# Patient Record
Sex: Male | Born: 1990 | Race: Black or African American | Marital: Single | Smoking: Former smoker
Health system: Southern US, Community
[De-identification: ages and names within clinical notes are randomized; demographics above are authoritative.]

## PROBLEM LIST (undated history)

## (undated) DIAGNOSIS — I10 Essential (primary) hypertension: Secondary | ICD-10-CM

## (undated) DIAGNOSIS — J302 Other seasonal allergic rhinitis: Secondary | ICD-10-CM

---

## 2016-11-27 ENCOUNTER — Ambulatory Visit: Payer: Self-pay

## 2016-11-27 ENCOUNTER — Ambulatory Visit (INDEPENDENT_AMBULATORY_CARE_PROVIDER_SITE_OTHER): Payer: Self-pay

## 2016-11-27 ENCOUNTER — Encounter: Payer: Self-pay | Admitting: *Deleted

## 2016-11-27 ENCOUNTER — Ambulatory Visit
Admission: EM | Admit: 2016-11-27 | Discharge: 2016-11-27 | Disposition: A | Discharge status: Home / Self Care | Payer: 59 | Attending: Family Medicine | Admitting: Family Medicine

## 2016-11-27 DIAGNOSIS — S61211A Laceration without foreign body of left index finger without damage to nail, initial encounter: Secondary | ICD-10-CM | POA: Diagnosis not present

## 2016-11-27 DIAGNOSIS — W260XXA Contact with knife, initial encounter: Secondary | ICD-10-CM | POA: Diagnosis not present

## 2016-11-27 DIAGNOSIS — S61209A Unspecified open wound of unspecified finger without damage to nail, initial encounter: Secondary | ICD-10-CM

## 2016-11-27 DIAGNOSIS — S61219A Laceration without foreign body of unspecified finger without damage to nail, initial encounter: Secondary | ICD-10-CM

## 2016-11-27 DIAGNOSIS — Z23 Encounter for immunization: Secondary | ICD-10-CM | POA: Diagnosis not present

## 2016-11-27 MED ORDER — LIDOCAINE-EPINEPHRINE-TETRACAINE (LET) SOLUTION
3.0000 mL | Freq: Once | NASAL | Status: AC
Start: 2016-11-27 — End: 2016-11-27
  Administered 2016-11-27: 3 mL via TOPICAL

## 2016-11-27 MED ORDER — AMOXICILLIN 875 MG PO TABS: 875.0000 mg | ORAL_TABLET | Freq: Two times a day (BID) | ORAL | 0 refills | Status: DC

## 2016-11-27 MED ORDER — TETANUS-DIPHTH-ACELL PERTUSSIS 5-2.5-18.5 LF-MCG/0.5 IM SUSP
0.5000 mL | Freq: Once | INTRAMUSCULAR | Status: AC
Start: 2016-11-27 — End: 2016-11-27
  Administered 2016-11-27: 0.5 mL via INTRAMUSCULAR

## 2016-11-27 NOTE — ED Triage Notes (Signed)
Patient accidentally cut his left index finger with his pocket knife today.

## 2016-11-27 NOTE — ED Provider Notes (Signed)
[facility]    CSN: [id number] Arrival date & time: 11/27/16  1558     History   Chief Complaint Chief Complaint  Patient presents with  . Extremity Laceration    HPI Brent Benson. is a 26 y.o. male.   26 yo male with a c/o left index finger laceration after accidentally cutting it with a knife several hours ago. Does not recall his last tetanus vaccine.    The history is provided by the patient.    History reviewed. No pertinent past medical history.  There are no active problems to display for this patient.   History reviewed. No pertinent surgical history.     Home Medications    Prior to Admission medications   Medication Sig Start Date End Date Taking? Authorizing Provider  amoxicillin (AMOXIL) 875 MG tablet Take 1 tablet (875 mg total) by mouth 2 (two) times daily. 11/27/16   Payton Mccallum, MD    Family History History reviewed. No pertinent family history.  Social History Social History  Substance Use Topics  . Smoking status: Current Some Day Smoker  . Smokeless tobacco: Never Used  . Alcohol use Yes     Allergies   Patient has no known allergies.   Review of Systems Review of Systems   Physical Exam Triage Vital Signs ED Triage Vitals  Enc Vitals Group     BP 11/27/16 1607 (!) 147/96     Pulse Rate 11/27/16 1607 (!) 113     Resp 11/27/16 1607 16     Temp 11/27/16 1607 99.4 F (37.4 C)     Temp Source 11/27/16 1607 Oral     SpO2 11/27/16 1607 97 %     Weight 11/27/16 1609 268 lb (121.6 kg)     Height 11/27/16 1609 5\' 10"  (1.778 m)     Head Circumference --      Peak Flow --      Pain Score 11/27/16 1609 10     Pain Loc --      Pain Edu? --      Excl. in GC? --    No data found.   Updated Vital Signs BP (!) 147/96 (BP Location: Right Arm)   Pulse (!) 113   Temp 99.4 F (37.4 C) (Oral)   Resp 16   Ht 5\' 10"  (1.778 m)   Wt 268 lb (121.6 kg)   SpO2 97%   BMI 38.45 kg/m   Visual Acuity Right Eye  Distance:   Left Eye Distance:   Bilateral Distance:    Right Eye Near:   Left Eye Near:    Bilateral Near:     Physical Exam  Constitutional: He appears well-developed and well-nourished.  Musculoskeletal:       Left hand: He exhibits tenderness, laceration (5cm left index finger laceration) and swelling. He exhibits normal range of motion, no bony tenderness, normal two-point discrimination, normal capillary refill and no deformity. Normal sensation noted. Normal strength noted.       Hands: Nursing note and vitals reviewed.    UC Treatments / Results  Labs (all labs ordered are listed, but only abnormal results are displayed) Labs Reviewed - No data to display  EKG  EKG Interpretation None       Radiology Dg Finger Index Left  Result Date: 11/27/2016 CLINICAL DATA:  Laceration left index finger with a pocket knife today. Initial encounter. EXAM: LEFT INDEX FINGER 2+V COMPARISON:  None. FINDINGS: No fracture, dislocation or radiopaque  foreign body is identified. Laceration along the radial aspect of the index finger at approximately the level of the PIP joint noted. IMPRESSION: Laceration without underlying fracture or foreign body. Electronically Signed   By: Drusilla Kanner M.D.   On: 11/27/2016 17:29    Procedures .Marland KitchenLaceration Repair Date/Time: 11/27/2016 8:05 PM Performed by: Payton Mccallum Authorized by: Payton Mccallum   Consent:    Consent obtained:  Verbal   Consent given by:  Patient   Risks discussed:  Infection, need for additional repair, nerve damage, pain, poor cosmetic result, retained foreign body, tendon damage and vascular damage Anesthesia (see MAR for exact dosages):    Anesthesia method:  Topical application and local infiltration   Topical anesthetic:  LET   Local anesthetic:  Lidocaine 1% WITH epi Laceration details:    Location:  Finger   Finger location:  L index finger   Length (cm):  5 Repair type:    Repair type:   Simple Pre-procedure details:    Preparation:  Patient was prepped and draped in usual sterile fashion Exploration:    Hemostasis achieved with:  Cautery, direct pressure, LET, tourniquet and epinephrine   Wound exploration: wound explored through full range of motion and entire depth of wound probed and visualized     Wound extent: areolar tissue violated     Wound extent: no foreign bodies/material noted, no tendon damage noted and no underlying fracture noted     Contaminated: no   Treatment:    Area cleansed with:  Betadine and Hibiclens   Amount of cleaning:  Standard   Irrigation solution:  Sterile saline   Irrigation method:  Syringe   Foreign body removal: no foreign bodies visualized.   Skin repair:    Repair method:  Sutures   Suture size:  4-0   Suture material:  Nylon   Suture technique:  Simple interrupted   Number of sutures:  12 Approximation:    Approximation:  Close Post-procedure details:    Dressing:  Bulky dressing and splint for protection   Patient tolerance of procedure:  Tolerated well, no immediate complications    (including critical care time)  Medications Ordered in UC Medications  Tdap (BOOSTRIX) injection 0.5 mL (0.5 mLs Intramuscular Given 11/27/16 1642)  lidocaine-EPINEPHrine-tetracaine (LET) solution (3 mLs Topical Given 11/27/16 1651)     Initial Impression / Assessment and Plan / UC Course  I have reviewed the triage vital signs and the nursing notes.  Pertinent labs & imaging results that were available during my care of the patient were reviewed by me and considered in my medical decision making (see chart for details).       Final Clinical Impressions(s) / UC Diagnoses   Final diagnoses:  Laceration of left index finger without foreign body without damage to nail, initial encounter    New Prescriptions Discharge Medication List as of 11/27/2016  7:06 PM    START taking these medications   Details  amoxicillin (AMOXIL) 875 MG  tablet Take 1 tablet (875 mg total) by mouth 2 (two) times daily., Starting Wed 11/27/2016, Normal       1. diagnosis reviewed with patient 2. Laceration repair as per note above 3. Given tetanus vaccine 4. rx as per orders above; reviewed possible side effects, interactions, risks and benefits  5. Recommend supportive treatment with routine wound care (verbal and written information given) 6. Follow-up in 8 days for suture removal (or sooner prn)   Controlled Substance Prescriptions [place] Controlled Substance Registry  consulted? Not Applicable   Payton Mccallum, MD 11/27/16 [phone]

## 2016-11-27 NOTE — Discharge Instructions (Signed)
Follow up next Friday morning for suture removal (or sooner if any problems)

## 2016-12-06 ENCOUNTER — Ambulatory Visit
Admission: EM | Admit: 2016-12-06 | Discharge: 2016-12-06 | Disposition: A | Discharge status: Home / Self Care | Payer: 59 | Attending: Family Medicine | Admitting: Family Medicine

## 2016-12-06 DIAGNOSIS — Z5189 Encounter for other specified aftercare: Secondary | ICD-10-CM | POA: Diagnosis not present

## 2016-12-06 MED ORDER — AMOXICILLIN 875 MG PO TABS: 875.0000 mg | ORAL_TABLET | Freq: Two times a day (BID) | ORAL | 0 refills | Status: DC

## 2016-12-06 NOTE — ED Triage Notes (Signed)
Pt in for suture removal left hand index finger. Denies pain, Edges well approximated, denies drainage.

## 2016-12-06 NOTE — ED Provider Notes (Signed)
[facility]    CSN: [id number] Arrival date & time: 12/06/16  1145     History   Chief Complaint Chief Complaint  Patient presents with  . Suture / Staple Removal    HPI Brent BreedMark Anthony Bibian Jr. is a 26 y.o. male.   26 yo male with a h/o finger laceration here for recheck wound and possible suture removal. States doing well/better but still with slight pain/tenderness after "bumping finger" several days ago. Denies any fevers, chills.    The history is provided by the patient.  Suture / Staple Removal     History reviewed. No pertinent past medical history.  There are no active problems to display for this patient.   History reviewed. No pertinent surgical history.     Home Medications    Prior to Admission medications   Medication Sig Start Date End Date Taking? Authorizing Provider  amoxicillin (AMOXIL) 875 MG tablet Take 1 tablet (875 mg total) by mouth 2 (two) times daily. 12/06/16   Payton Mccallumonty, Carsyn Boster, MD    Family History History reviewed. No pertinent family history.  Social History Social History  Substance Use Topics  . Smoking status: Current Some Day Smoker  . Smokeless tobacco: Never Used  . Alcohol use Yes     Allergies   Patient has no known allergies.   Review of Systems Review of Systems   Physical Exam Triage Vital Signs ED Triage Vitals  Enc Vitals Group     BP 12/06/16 1147 (!) 151/99     Pulse Rate 12/06/16 1147 77     Resp 12/06/16 1147 18     Temp 12/06/16 1147 98.9 F (37.2 C)     Temp Source 12/06/16 1147 Oral     SpO2 12/06/16 1147 100 %     Weight 12/06/16 1148 268 lb (121.6 kg)     Height 12/06/16 1148 5\' 10"  (1.778 m)     Head Circumference --      Peak Flow --      Pain Score 12/06/16 1205 0     Pain Loc --      Pain Edu? --      Excl. in GC? --    No data found.   Updated Vital Signs BP (!) 151/99 (BP Location: Left Arm)   Pulse 77   Temp 98.9 F (37.2 C) (Oral)   Resp 18   Ht 5\' 10"   (1.778 m)   Wt 268 lb (121.6 kg)   SpO2 100%   BMI 38.45 kg/m   Visual Acuity Right Eye Distance:   Left Eye Distance:   Bilateral Distance:    Right Eye Near:   Left Eye Near:    Bilateral Near:     Physical Exam  Constitutional: He appears well-developed and well-nourished. No distress.  Musculoskeletal:       Left hand: Lacerations: healling index finger laceration; no erythema or drainage; slight tenderness and slight edema noted.  Skin: He is not diaphoretic.  Nursing note and vitals reviewed.    UC Treatments / Results  Labs (all labs ordered are listed, but only abnormal results are displayed) Labs Reviewed - No data to display  EKG  EKG Interpretation None       Radiology No results found.  Procedures Procedures (including critical care time)  Medications Ordered in UC Medications - No data to display   Initial Impression / Assessment and Plan / UC Course  I have reviewed the triage vital signs and  the nursing notes.  Pertinent labs & imaging results that were available during my care of the patient were reviewed by me and considered in my medical decision making (see chart for details).       Final Clinical Impressions(s) / UC Diagnoses   Final diagnoses:  Visit for wound check    New Prescriptions Discharge Medication List as of 12/06/2016 12:05 PM     1.diagnosis reviewed with patient/ 2. Recommend supportive treatment with continued wound care; refilled antibiotic;  4. Follow-up in 3 days for suture removal  Controlled Substance Prescriptions [place] Controlled Substance Registry consulted? Not Applicable   Payton Mccallum, MD 12/06/16 2207

## 2016-12-06 NOTE — Discharge Instructions (Signed)
Follow up next Monday or Tuesday for suture removal

## 2016-12-10 ENCOUNTER — Ambulatory Visit
Admission: EM | Admit: 2016-12-10 | Discharge: 2016-12-10 | Disposition: A | Discharge status: Home / Self Care | Payer: 59 | Attending: Family Medicine | Admitting: Family Medicine

## 2016-12-10 DIAGNOSIS — Z4802 Encounter for removal of sutures: Secondary | ICD-10-CM

## 2016-12-10 NOTE — ED Triage Notes (Signed)
Pt here for suture removal.left hand first finger. Edges well approximated and denies pain or drainage.

## 2016-12-10 NOTE — ED Provider Notes (Signed)
[facility]    CSN: [id number] Arrival date & time: 12/10/16  [id number]     History   Chief Complaint Chief Complaint  Patient presents with  . Suture / Staple Removal    HPI Brent BreedMark Anthony Vasek Jr. is a 26 y.o. male.   HPI  This a 26 year old male who presents 14 days after sutures were placed into his left hand index finger. The edges are well approximated the sutures are in good condition. There is no drainage. Does have some mild tenderness along the proximalmost portion. His finger is stiff from nonuse. There is no evidence of any infection.        History reviewed. No pertinent past medical history.  There are no active problems to display for this patient.   History reviewed. No pertinent surgical history.     Home Medications    Prior to Admission medications   Medication Sig Start Date End Date Taking? Authorizing Provider  amoxicillin (AMOXIL) 875 MG tablet Take 1 tablet (875 mg total) by mouth 2 (two) times daily. 12/06/16   Payton Mccallumonty, [place], MD    Family History History reviewed. No pertinent family history.  Social History Social History  Substance Use Topics  . Smoking status: Current Some Day Smoker  . Smokeless tobacco: Never Used  . Alcohol use Yes     Allergies   Patient has no known allergies.   Review of Systems Review of Systems  Constitutional: Positive for activity change. Negative for chills, fatigue and fever.  Skin: Positive for wound.  All other systems reviewed and are negative.    Physical Exam Triage Vital Signs ED Triage Vitals  Enc Vitals Group     BP 12/10/16 1012 (!) 152/89     Pulse Rate 12/10/16 1012 85     Resp 12/10/16 1012 18     Temp 12/10/16 1012 98.8 F (37.1 C)     Temp Source 12/10/16 1012 Oral     SpO2 12/10/16 1012 100 %     Weight 12/10/16 1012 268 lb (121.6 kg)     Height 12/10/16 1012 5\' 10"  (1.778 m)     Head Circumference --      Peak Flow --      Pain Score 12/10/16 1049 0   Pain Loc --      Pain Edu? --      Excl. in GC? --    No data found.   Updated Vital Signs BP (!) 152/89 (BP Location: Right Arm)   Pulse 85   Temp 98.8 F (37.1 C) (Oral)   Resp 18   Ht 5\' 10"  (1.778 m)   Wt 268 lb (121.6 kg)   SpO2 100%   BMI 38.45 kg/m   Visual Acuity Right Eye Distance:   Left Eye Distance:   Bilateral Distance:    Right Eye Near:   Left Eye Near:    Bilateral Near:     Physical Exam  Constitutional: He is oriented to person, place, and time. He appears well-developed and well-nourished. No distress.  HENT:  Head: Normocephalic.  Eyes: Pupils are equal, round, and reactive to light.  Neck: Normal range of motion.  Musculoskeletal: He exhibits edema and tenderness.  Examination of the left nondominant index finger shows a well-healed incision with numerous sutures in place. There is no evidence of any drainage there is no erythema. The patient's joints DIP PIP and MP joint are all stiff.  Neurological: He is alert and oriented to person,  place, and time.  Skin: Skin is warm and dry. He is not diaphoretic.  Psychiatric: He has a normal mood and affect. His behavior is normal. Judgment and thought content normal.  Nursing note and vitals reviewed.    UC Treatments / Results  Labs (all labs ordered are listed, but only abnormal results are displayed) Labs Reviewed - No data to display  EKG  EKG Interpretation None       Radiology No results found.  Procedures Procedures (including critical care time)  Medications Ordered in UC Medications - No data to display   Initial Impression / Assessment and Plan / UC Course  I have reviewed the triage vital signs and the nursing notes.  Pertinent labs & imaging results that were available during my care of the patient were reviewed by me and considered in my medical decision making (see chart for details).     Sutures were removed by the nurse. The skin edges were well opposed. No opening  of the skin was seen. There is no drainage. Swelling. I have instructed the patient and range of motion exercises and demonstrated to him. He should perform this a several times every hour. He denies any problems in the future should return to our clinic.  Final Clinical Impressions(s) / UC Diagnoses   Final diagnoses:  Encounter for removal of sutures    New Prescriptions Discharge Medication List as of 12/10/2016 10:51 AM       Controlled Substance Prescriptions [place] Controlled Substance Registry consulted? Not Applicable   Lutricia Feil, PA-C 12/10/16 2440

## 2016-12-10 NOTE — ED Notes (Signed)
Bacitracin to wound and covered with kerlix

## 2016-12-10 NOTE — ED Notes (Signed)
12 sutures removed left hand index finger

## 2017-08-05 ENCOUNTER — Encounter (HOSPITAL_COMMUNITY): Payer: Self-pay | Admitting: Emergency Medicine

## 2017-08-05 ENCOUNTER — Ambulatory Visit (HOSPITAL_COMMUNITY)
Admission: EM | Admit: 2017-08-05 | Discharge: 2017-08-05 | Disposition: A | Discharge status: Home / Self Care | Payer: 59 | Attending: Family Medicine | Admitting: Family Medicine

## 2017-08-05 DIAGNOSIS — F17210 Nicotine dependence, cigarettes, uncomplicated: Secondary | ICD-10-CM | POA: Diagnosis not present

## 2017-08-05 DIAGNOSIS — Z202 Contact with and (suspected) exposure to infections with a predominantly sexual mode of transmission: Secondary | ICD-10-CM

## 2017-08-05 DIAGNOSIS — R369 Urethral discharge, unspecified: Secondary | ICD-10-CM | POA: Insufficient documentation

## 2017-08-05 DIAGNOSIS — Z113 Encounter for screening for infections with a predominantly sexual mode of transmission: Secondary | ICD-10-CM

## 2017-08-05 MED ORDER — AZITHROMYCIN 250 MG PO TABS
1000.0000 mg | ORAL_TABLET | Freq: Once | ORAL | Status: AC
  Administered 2017-08-05: 1000 mg via ORAL

## 2017-08-05 MED ORDER — CEFTRIAXONE SODIUM 250 MG IJ SOLR
INTRAMUSCULAR | Status: AC
  Filled 2017-08-05: qty 250

## 2017-08-05 MED ORDER — CEFTRIAXONE SODIUM 250 MG IJ SOLR
250.0000 mg | Freq: Once | INTRAMUSCULAR | Status: AC
  Administered 2017-08-05: 250 mg via INTRAMUSCULAR

## 2017-08-05 MED ORDER — AZITHROMYCIN 250 MG PO TABS
ORAL_TABLET | ORAL | Status: AC
  Filled 2017-08-05: qty 4

## 2017-08-05 NOTE — ED Triage Notes (Signed)
Pt sts penile discharge  

## 2017-08-05 NOTE — ED Provider Notes (Signed)
[facility]   [id number] 08/05/17 Arrival Time: 1131   SUBJECTIVE:  Brent Benson. is a 27 y.o. male who presents to the urgent care with complaint of penile discharge for the past week.  He engaged in some traumatic sex at the onset with his partner.  No fever or joint pain.  Yellow discharge.  He has been taking amoxicillin intermittently  History reviewed. No pertinent past medical history. History reviewed. No pertinent family history. Social History   Socioeconomic History  . Marital status: Single    Spouse name: Not on file  . Number of children: Not on file  . Years of education: Not on file  . Highest education level: Not on file  Occupational History  . Not on file  Social Needs  . Financial resource strain: Not on file  . Food insecurity:    Worry: Not on file    Inability: Not on file  . Transportation needs:    Medical: Not on file    Non-medical: Not on file  Tobacco Use  . Smoking status: Current Some Day Smoker  . Smokeless tobacco: Never Used  Substance and Sexual Activity  . Alcohol use: Yes  . Drug use: No  . Sexual activity: Not on file  Lifestyle  . Physical activity:    Days per week: Not on file    Minutes per session: Not on file  . Stress: Not on file  Relationships  . Social connections:    Talks on phone: Not on file    Gets together: Not on file    Attends religious service: Not on file    Active member of club or organization: Not on file    Attends meetings of clubs or organizations: Not on file    Relationship status: Not on file  . Intimate partner violence:    Fear of current or ex partner: Not on file    Emotionally abused: Not on file    Physically abused: Not on file    Forced sexual activity: Not on file  Other Topics Concern  . Not on file  Social History Narrative  . Not on file   No outpatient medications have been marked as taking for the 08/05/17 encounter [facility] Encounter).   No Known  Allergies    ROS: As per HPI, remainder of ROS negative.   OBJECTIVE:   Vitals:   08/05/17 1156  BP: (!) 157/104  Pulse: 73  Resp: 18  Temp: 98.5 F (36.9 C)  TempSrc: Oral  SpO2: 100%     General appearance: alert; no distress Eyes: PERRL; EOMI; conjunctiva normal HENT: normocephalic; atraumatic; TMs normal, canal normal, external ears normal without trauma; nasal mucosa normal; oral mucosa normal Neck: supple Penis:  Crusty meatus, shaft normal; circumcised Back: no CVA tenderness Extremities: no cyanosis or edema; symmetrical with no gross deformities Skin: warm and dry Neurologic: normal gait; grossly normal Psychological: alert and cooperative; normal mood and affect      Labs:  No results found for this or any previous visit.  Labs Reviewed  HIV ANTIBODY (ROUTINE TESTING)  RPR  URINE CYTOLOGY ANCILLARY ONLY    No results found.     ASSESSMENT & PLAN:  1. STD exposure   2. Penile discharge     Meds ordered this encounter  Medications  . azithromycin (ZITHROMAX) tablet 1,000 mg  . cefTRIAXone (ROCEPHIN) injection 250 mg    Reviewed expectations re: course of current medical issues. Questions answered. Outlined  signs and symptoms indicating need for more acute intervention. Patient verbalized understanding. After Visit Summary given.       Elvina Sidle, MD 08/05/17 1214

## 2017-08-06 LAB — URINE CYTOLOGY ANCILLARY ONLY
Chlamydia: NEGATIVE
Neisseria Gonorrhea: NEGATIVE
Trichomonas: NEGATIVE

## 2017-08-06 LAB — RPR: RPR Ser Ql: NONREACTIVE

## 2017-08-07 LAB — HIV ANTIBODY (ROUTINE TESTING W REFLEX): HIV Screen 4th Generation wRfx: NONREACTIVE

## 2017-08-07 NOTE — Progress Notes (Signed)
Attempted to reach patient regarding normal result.

## 2019-01-29 ENCOUNTER — Other Ambulatory Visit: Payer: Self-pay

## 2019-01-29 DIAGNOSIS — Z20822 Contact with and (suspected) exposure to covid-19: Secondary | ICD-10-CM

## 2019-01-31 LAB — NOVEL CORONAVIRUS, NAA: SARS-CoV-2, NAA: NOT DETECTED

## 2019-04-21 ENCOUNTER — Other Ambulatory Visit: Payer: Self-pay

## 2019-04-21 ENCOUNTER — Ambulatory Visit (HOSPITAL_COMMUNITY)
Admission: EM | Admit: 2019-04-21 | Discharge: 2019-04-21 | Disposition: A | Discharge status: Home / Self Care | Payer: 59 | Attending: Family Medicine | Admitting: Family Medicine

## 2019-04-21 ENCOUNTER — Encounter (HOSPITAL_COMMUNITY): Payer: Self-pay

## 2019-04-21 DIAGNOSIS — R253 Fasciculation: Secondary | ICD-10-CM

## 2019-04-21 DIAGNOSIS — I10 Essential (primary) hypertension: Secondary | ICD-10-CM

## 2019-04-21 MED ORDER — AMLODIPINE BESYLATE 5 MG PO TABS: 5.0000 mg | ORAL_TABLET | Freq: Every day | ORAL | 1 refills | Status: DC

## 2019-04-21 NOTE — ED Provider Notes (Signed)
[facility]   [id number] 04/21/19 Arrival Time: 4098  ASSESSMENT & PLAN:  1. Muscle fasciculation   2. Uncontrolled hypertension     Comfortable with observation of R forearm muscle fasciculations first noticed this morning. Declines any lab testing. Would like to begin HTN medication again; unsure what he has taken in the past.  Begin: Meds ordered this encounter  Medications  . amLODipine (NORVASC) 5 MG tablet    Sig: Take 1 tablet (5 mg total) by mouth daily.    Dispense:  30 tablet    Refill:  1    Recommend: Follow-up Information    Schedule an appointment as soon as possible for a visit  with [facility].   Contact information: [address] [place] [phone]       Schedule an appointment as soon as possible for a visit  with Primary Care at [facility].   Specialty: Family Medicine Contact information: [address] [place] [phone]           Reviewed expectations re: course of current medical issues. Questions answered. Outlined signs and symptoms indicating need for more acute intervention. Patient verbalized understanding. After Visit Summary given.  SUBJECTIVE: History from: patient. Brent Benson. is a 29 y.o. male who reports noticing "tingling" of his right forearm upon waking this morning. "Like skin was twitching". Still feeling occasionally but has improved. No h/o similar. No injury to area. Questions if he slept wrong on it. No associated pain. No extremity sensation changes or weakness. No new medications. No specific aggravating or alleviating factors reported.  Increased blood pressure noted today. Reports that he has been treated for hypertension in the past.  He reports no chest pain on exertion, no dyspnea on exertion, no swelling of ankles, no orthostatic dizziness or lightheadedness, no orthopnea or paroxysmal  nocturnal dyspnea, no palpitations and no intermittent claudication symptoms. Hasn't taken medication in a long time.   History reviewed. No pertinent surgical history.   ROS: As per HPI. All other systems negative.    OBJECTIVE:  Vitals:   04/21/19 1024 04/21/19 1025  BP:  (!) 173/93  Pulse:  77  Resp:  18  Temp:  98.4 F (36.9 C)  TempSrc:  Oral  SpO2:  100%  Weight: 131.5 kg     General appearance: alert; no distress HEENT: [place]; AT Neck: supple with FROM Resp: unlabored respirations Extremities: . RUE: warm with well perfused appearance; without localized tenderness over right forearm; no visualized muscle fasciculations; without gross deformities; swelling: none; bruising: none; elbow and wrist ROM: normal CV: brisk extremity capillary refill of RUE; 2+ radial pulse of RUE. Skin: warm and dry; no visible rashes Neurologic: gait normal; normal sensation and strength of RUE Psychological: alert and cooperative; normal mood and affect   No Known Allergies  PMH: HTN  Social History   Socioeconomic History  . Marital status: Single    Spouse name: Not on file  . Number of children: Not on file  . Years of education: Not on file  . Highest education level: Not on file  Occupational History  . Not on file  Tobacco Use  . Smoking status: Current Some Day Smoker  . Smokeless tobacco: Never Used  Substance and Sexual Activity  . Alcohol use: Yes  . Drug use: No  . Sexual activity: Not on file  Other Topics Concern  . Not on file  Social  History Narrative  . Not on file   Social Determinants of Health   Financial Resource Strain:   . Difficulty of Paying Living Expenses: Not on file  Food Insecurity:   . Worried About Programme researcher, broadcasting/film/video in the Last Year: Not on file  . Ran Out of Food in the Last Year: Not on file  Transportation Needs:   . Lack of Transportation (Medical): Not on file  . Lack of Transportation (Non-Medical): Not on file  Physical  Activity:   . Days of Exercise per Week: Not on file  . Minutes of Exercise per Session: Not on file  Stress:   . Feeling of Stress : Not on file  Social Connections:   . Frequency of Communication with Friends and Family: Not on file  . Frequency of Social Gatherings with Friends and Family: Not on file  . Attends Religious Services: Not on file  . Active Member of Clubs or Organizations: Not on file  . Attends Banker Meetings: Not on file  . Marital Status: Not on file   History reviewed. No pertinent surgical history.    [name], MD 04/21/19 1236

## 2019-04-21 NOTE — ED Triage Notes (Signed)
Pt states he wake up his right forearm was tingling.

## 2019-04-29 ENCOUNTER — Encounter: Payer: Self-pay | Admitting: Internal Medicine

## 2019-04-29 ENCOUNTER — Other Ambulatory Visit: Payer: Self-pay

## 2019-04-29 ENCOUNTER — Ambulatory Visit (INDEPENDENT_AMBULATORY_CARE_PROVIDER_SITE_OTHER): Payer: 59 | Admitting: Internal Medicine

## 2019-04-29 VITALS — BP 133/74 | HR 86 | Temp 98.7°F | Ht 70.0 in | Wt 290.9 lb

## 2019-04-29 DIAGNOSIS — I10 Essential (primary) hypertension: Secondary | ICD-10-CM | POA: Insufficient documentation

## 2019-04-29 DIAGNOSIS — Z79899 Other long term (current) drug therapy: Secondary | ICD-10-CM

## 2019-04-29 NOTE — Patient Instructions (Addendum)
Thank you for allowing [place] to provide your care today. Today we discussed your high blood pressure.   I have ordered the following labs for you:  Lipid panel, basic metabolic panel. Hemoglobin a1c   I will contact your with the results.     Please continue your amlodipine daily.   Please follow-up in six months for blood pressure check.    Please call the internal medicine center clinic if you have any questions or concerns, we may be able to help and keep you from a long and expensive emergency room wait. Our clinic and after hours phone number is [phone], the best time to call is Monday through Friday 9 am to 4 pm but there is always someone available 24/7 if you have an emergency. If you need medication refills please notify your pharmacy one week in advance and they will send [place] a request.

## 2019-04-29 NOTE — Assessment & Plan Note (Addendum)
Diagnosed with hypertension at around age 29 or 73 but this was stopped as his blood pressure was controlled. Was started on amlodipine 5mg  one week ago when he was seen in urgent care for other illness. Blood pressure is now improved to 133/74. He states he has changed his diet and is eating healthier and drinking water only.   - encouraged continued healthy diet - discussed meeting with nutritionist but he will wait for now - lipid panel, a1c, and bmp  - cont. Amlodipine 5mg  qd - f/u six months for bp check  ADDENDUM: elevated cholesterol, LDL, triglycerides and decreased HDL. Discussed results within. No statin under ASCVD risk of 3.7% at this time, but did discuss the importance of lifestyle changes in long term prevention.

## 2019-04-29 NOTE — Progress Notes (Signed)
   CC: high blood pressure   HPI:  Mr.Brent Benson. is a 29 y.o. with PMH as below.   Please see A&P for assessment of the patient's acute and chronic medical conditions.   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  PMH: Hypertension  Review of Systems:   Review of Systems  Constitutional: Negative for malaise/fatigue and weight loss.  Eyes: Negative for blurred vision and double vision.  Respiratory: Negative for cough, shortness of breath and wheezing.   Cardiovascular: Negative for chest pain, palpitations and leg swelling.  Gastrointestinal: Negative for abdominal pain and nausea.  Genitourinary: Negative for dysuria, frequency and urgency.  Neurological: Negative for dizziness, sensory change, focal weakness and weakness.   Physical Exam: Constitution: NAD, appears stated age HEENT: eom intact, no icterus or injection Cardio: RRR, no m/r/g, no LE edema  Respiratory: CTA, no w/r/r Abdominal: NTTP, soft, non-distended MSK: moving all extremities, strength symmetric  Neuro: normal affect, a&ox3 Skin: c/d/i    Vitals:   04/29/19 0950  BP: 133/74  Pulse: 86  Temp: 98.7 F (37.1 C)  TempSrc: Oral  SpO2: 100%  Weight: 290 lb 14.4 oz (132 kg)  Height: 5\' 10"  (1.778 m)    Assessment & Plan:   See Encounters Tab for problem based charting.  Patient discussed with Dr. 

## 2019-04-30 LAB — BMP8+ANION GAP
Anion Gap: 17 mmol/L (ref 10.0–18.0)
BUN/Creatinine Ratio: 12 (ref 9–20)
BUN: 11 mg/dL (ref 6–20)
CO2: 21 mmol/L (ref 20–29)
Calcium: 9.8 mg/dL (ref 8.7–10.2)
Chloride: 101 mmol/L (ref 96–106)
Creatinine, Ser: 0.89 mg/dL (ref 0.76–1.27)
GFR calc Af Amer: 135 mL/min/{1.73_m2} (ref >59)
GFR calc non Af Amer: 117 mL/min/{1.73_m2} (ref >59)
Glucose: 85 mg/dL (ref 65–99)
Potassium: 4 mmol/L (ref 3.5–5.2)
Sodium: 139 mmol/L (ref 134–144)

## 2019-04-30 LAB — LIPID PANEL
Chol/HDL Ratio: 7.6 ratio — ABNORMAL HIGH (ref 0.0–5.0)
Cholesterol, Total: 221 mg/dL — ABNORMAL HIGH (ref 100–199)
HDL: 29 mg/dL — ABNORMAL LOW (ref >39)
LDL Chol Calc (NIH): 147 mg/dL — ABNORMAL HIGH (ref 0–99)
Triglycerides: 244 mg/dL — ABNORMAL HIGH (ref 0–149)
VLDL Cholesterol Cal: 45 mg/dL — ABNORMAL HIGH (ref 5–40)

## 2019-04-30 LAB — HEMOGLOBIN A1C
Est. average glucose Bld gHb Est-mCnc: 120 mg/dL
Hgb A1c MFr Bld: 5.8 % — ABNORMAL HIGH (ref 4.8–5.6)

## 2019-04-30 NOTE — Progress Notes (Signed)
[facility] Clinic Attending  Case discussed with Dr. Seawell at the time of the visit.  We reviewed the resident's history and exam and pertinent patient test results.  I agree with the assessment, diagnosis, and plan of care documented in the resident's note.    

## 2019-08-25 ENCOUNTER — Other Ambulatory Visit: Payer: Self-pay

## 2019-08-25 ENCOUNTER — Encounter (HOSPITAL_COMMUNITY): Payer: Self-pay

## 2019-08-25 ENCOUNTER — Ambulatory Visit (HOSPITAL_COMMUNITY)
Admission: EM | Admit: 2019-08-25 | Discharge: 2019-08-25 | Disposition: A | Discharge status: Home / Self Care | Payer: 59 | Attending: Emergency Medicine | Admitting: Emergency Medicine

## 2019-08-25 DIAGNOSIS — Z7901 Long term (current) use of anticoagulants: Secondary | ICD-10-CM | POA: Insufficient documentation

## 2019-08-25 DIAGNOSIS — J320 Chronic maxillary sinusitis: Secondary | ICD-10-CM | POA: Insufficient documentation

## 2019-08-25 DIAGNOSIS — Z20822 Contact with and (suspected) exposure to covid-19: Secondary | ICD-10-CM | POA: Insufficient documentation

## 2019-08-25 DIAGNOSIS — Z87891 Personal history of nicotine dependence: Secondary | ICD-10-CM | POA: Insufficient documentation

## 2019-08-25 DIAGNOSIS — Z79899 Other long term (current) drug therapy: Secondary | ICD-10-CM | POA: Insufficient documentation

## 2019-08-25 DIAGNOSIS — I10 Essential (primary) hypertension: Secondary | ICD-10-CM | POA: Diagnosis not present

## 2019-08-25 MED ORDER — CETIRIZINE HCL 10 MG PO TABS: 10.0000 mg | ORAL_TABLET | Freq: Every day | ORAL | 0 refills | Status: DC

## 2019-08-25 MED ORDER — FLUTICASONE PROPIONATE 50 MCG/ACT NA SUSP: 1.0000 | Freq: Every day | NASAL | 2 refills | Status: DC

## 2019-08-25 MED ORDER — AMOXICILLIN-POT CLAVULANATE 875-125 MG PO TABS: 1.0000 | ORAL_TABLET | Freq: Two times a day (BID) | ORAL | 0 refills | Status: DC

## 2019-08-25 NOTE — Discharge Instructions (Signed)
Treating you for allergies and a sinus infection.  Medication as prescribed.  Make sure to complaining of fluids and staying hydrated Follow up as needed for continued or worsening symptoms

## 2019-08-25 NOTE — ED Triage Notes (Signed)
Patient reports a few weeks ago he started feeling poorly. Reports sinus pressure behind the eyes, cheeks, and forehead. Denies runny nose or cough.

## 2019-08-25 NOTE — ED Provider Notes (Signed)
[place]    CSN: [id number] Arrival date & time: 08/25/19  1006      History   Chief Complaint Chief Complaint  Patient presents with  . Recurrent Sinusitis    HPI Brent Benson. is a 29 y.o. male.   Patient is a 29 year old male presents today with approximately 2 weeks or more of sinus congestion, pressure behind the eyes, postnasal drip, feeling fatigued.  Symptoms have been constant worsening.  Reporting he has been having some mild nausea also.  Denies any abdominal pain, vomiting or diarrhea.  History of seasonal allergies and is not taking any allergy medication.  Was at work last night and felt super dehydrated and has been drinking Pedialyte and water.  Checked his blood sugar last night at home and was 117.  Denies any history of diabetes.  Denies any specific increased thirst, frequent urination, dizziness.  No fever, chills, body aches or night sweats.  No cough or chest congestion.  ROS per HPI      History reviewed. No pertinent past medical history.  Patient Active Problem List   Diagnosis Date Noted  . Hypertension 04/29/2019    History reviewed. No pertinent surgical history.     Home Medications    Prior to Admission medications   Medication Sig Start Date End Date Taking? Authorizing Provider  amLODipine (NORVASC) 5 MG tablet Take 1 tablet (5 mg total) by mouth daily. 04/21/19  Yes Vanessa Kick, MD  amoxicillin-clavulanate (AUGMENTIN) 875-125 MG tablet Take 1 tablet by mouth every 12 (twelve) hours. 08/25/19   Loura Halt A, NP  cetirizine (ZYRTEC) 10 MG tablet Take 1 tablet (10 mg total) by mouth daily. 08/25/19   Lenus Trauger, Tressia Miners A, NP  fluticasone (FLONASE) 50 MCG/ACT nasal spray Place 1 spray into both nostrils daily. 08/25/19   Orvan July, NP    Family History History reviewed. No pertinent family history.  Social History Social History   Tobacco Use  . Smoking status: Former Smoker    Quit date: 12/28/2018    Years  since quitting: 0.6  . Smokeless tobacco: Never Used  Substance Use Topics  . Alcohol use: Yes  . Drug use: No     Allergies   Patient has no known allergies.   Review of Systems Review of Systems   Physical Exam Triage Vital Signs ED Triage Vitals  Enc Vitals Group     BP 08/25/19 1120 (!) 150/105     Pulse Rate 08/25/19 1120 69     Resp 08/25/19 1120 16     Temp 08/25/19 1120 98.4 F (36.9 C)     Temp Source 08/25/19 1120 Oral     SpO2 08/25/19 1120 100 %     Weight --      Height --      Head Circumference --      Peak Flow --      Pain Score 08/25/19 1118 0     Pain Loc --      Pain Edu? --      Excl. in [place]? --    No data found.  Updated Vital Signs BP (!) 150/105 (BP Location: Right Arm)   Pulse 69   Temp 98.4 F (36.9 C) (Oral)   Resp 16   SpO2 100%   Visual Acuity Right Eye Distance:   Left Eye Distance:   Bilateral Distance:    Right Eye Near:   Left Eye Near:    Bilateral Near:  Physical Exam Vitals and nursing note reviewed.  Constitutional:      General: He is not in acute distress.    Appearance: Normal appearance. He is not ill-appearing, toxic-appearing or diaphoretic.  HENT:     Head: Normocephalic and atraumatic.     Right Ear: Tympanic membrane and ear canal normal.     Left Ear: Tympanic membrane and ear canal normal.     Nose:     Right Turbinates: Swollen.     Left Turbinates: Swollen.     Right Sinus: Maxillary sinus tenderness present.     Left Sinus: Maxillary sinus tenderness present.     Mouth/Throat:     Pharynx: Oropharynx is clear.  Eyes:     Conjunctiva/sclera: Conjunctivae normal.  Cardiovascular:     Rate and Rhythm: Normal rate and regular rhythm.  Pulmonary:     Effort: Pulmonary effort is normal.     Breath sounds: Normal breath sounds.  Musculoskeletal:        General: Normal range of motion.     Cervical back: Normal range of motion.  Skin:    General: Skin is warm and dry.  Neurological:      Mental Status: He is alert.  Psychiatric:        Mood and Affect: Mood normal.      UC Treatments / Results  Labs (all labs ordered are listed, but only abnormal results are displayed) Labs Reviewed  SARS CORONAVIRUS 2 (TAT 6-24 HRS)    EKG   Radiology No results found.  Procedures Procedures (including critical care time)  Medications Ordered in UC Medications - No data to display  Initial Impression / Assessment and Plan / UC Course  I have reviewed the triage vital signs and the nursing notes.  Pertinent labs & imaging results that were available during my care of the patient were reviewed by me and considered in my medical decision making (see chart for details).     Maxillary sinusitis Treating with Flonase, Zyrtec and Augmentin. Recommended continue to fluids and stay hydrated. Work note given to rest Follow up as needed for continued or worsening symptoms  Final Clinical Impressions(s) / UC Diagnoses   Final diagnoses:  Chronic maxillary sinusitis     Discharge Instructions     Treating you for allergies and a sinus infection.  Medication as prescribed.  Make sure to complaining of fluids and staying hydrated Follow up as needed for continued or worsening symptoms     ED Prescriptions    Medication Sig Dispense Auth. Provider   cetirizine (ZYRTEC) 10 MG tablet Take 1 tablet (10 mg total) by mouth daily. 30 tablet Rosena Bartle A, NP   fluticasone (FLONASE) 50 MCG/ACT nasal spray Place 1 spray into both nostrils daily. 16 g Jayme Mednick A, NP   amoxicillin-clavulanate (AUGMENTIN) 875-125 MG tablet Take 1 tablet by mouth every 12 (twelve) hours. 14 tablet Allice Garro A, NP     PDMP not reviewed this encounter.   Janace Aris, NP 08/25/19 1159

## 2019-08-26 LAB — SARS CORONAVIRUS 2 (TAT 6-24 HRS): SARS Coronavirus 2: NEGATIVE

## 2019-08-27 ENCOUNTER — Encounter (HOSPITAL_COMMUNITY): Payer: Self-pay

## 2019-08-27 ENCOUNTER — Other Ambulatory Visit: Payer: Self-pay

## 2019-08-27 ENCOUNTER — Ambulatory Visit (HOSPITAL_COMMUNITY)
Admission: EM | Admit: 2019-08-27 | Discharge: 2019-08-27 | Disposition: A | Discharge status: Home / Self Care | Payer: 59

## 2019-08-27 DIAGNOSIS — R200 Anesthesia of skin: Secondary | ICD-10-CM

## 2019-08-27 NOTE — ED Triage Notes (Signed)
Patient here to follow up about sinus infection, diagnosed 5/19.

## 2019-08-27 NOTE — Discharge Instructions (Addendum)
I do not see anything concerning.  Keep taking medication as prescribed.  You can do Mucinex in addition as you asked. Rest, stay in a cool environment and to stay hydrated. Work note given to rest

## 2019-08-30 NOTE — ED Provider Notes (Signed)
[name]    CSN: [id number] Arrival date & time: 08/27/19  1259      History   Chief Complaint Chief Complaint  Patient presents with  . Sinus Problem    follow up    HPI Brent Benson. is a 29 y.o. male.   Patient is a 29 year old male presents today for follow-up of sinus issue.  Reporting he believes he may be having a reaction to the amoxicillin he was prescribed for sinus infection.  Reporting some intermittent tingling, numbess in tongue.  This only comes when he gets really stressed out at work.  Denies any specific history of anxiety.  Otherwise he feels at this time his issues are starting to improve.  No oral swelling, trouble swallowing or breathing.  No rashes or fevers.  ROS per HPI      History reviewed. No pertinent past medical history.  Patient Active Problem List   Diagnosis Date Noted  . Hypertension 04/29/2019    History reviewed. No pertinent surgical history.     Home Medications    Prior to Admission medications   Medication Sig Start Date End Date Taking? Authorizing Provider  amLODipine (NORVASC) 5 MG tablet Take 1 tablet (5 mg total) by mouth daily. 04/21/19   Vanessa Kick, MD  amoxicillin-clavulanate (AUGMENTIN) 875-125 MG tablet Take 1 tablet by mouth every 12 (twelve) hours. 08/25/19   Loura Halt A, NP  cetirizine (ZYRTEC) 10 MG tablet Take 1 tablet (10 mg total) by mouth daily. 08/25/19   Vashon Arch, Tressia Miners A, NP  fluticasone (FLONASE) 50 MCG/ACT nasal spray Place 1 spray into both nostrils daily. 08/25/19   Orvan July, NP    Family History History reviewed. No pertinent family history.  Social History Social History   Tobacco Use  . Smoking status: Former Smoker    Quit date: 12/28/2018    Years since quitting: 0.6  . Smokeless tobacco: Never Used  Substance Use Topics  . Alcohol use: Yes  . Drug use: No     Allergies   Patient has no known allergies.   Review of Systems Review of  Systems   Physical Exam Triage Vital Signs ED Triage Vitals  Enc Vitals Group     BP 08/27/19 1419 136/79     Pulse Rate 08/27/19 1419 72     Resp 08/27/19 1419 16     Temp 08/27/19 1419 98.6 F (37 C)     Temp Source 08/27/19 1419 Oral     SpO2 08/27/19 1419 100 %     Weight --      Height --      Head Circumference --      Peak Flow --      Pain Score 08/27/19 1416 0     Pain Loc --      Pain Edu? --      Excl. in [place]? --    No data found.  Updated Vital Signs BP 136/79 (BP Location: Right Arm)   Pulse 72   Temp 98.6 F (37 C) (Oral)   Resp 16   SpO2 100%   Visual Acuity Right Eye Distance:   Left Eye Distance:   Bilateral Distance:    Right Eye Near:   Left Eye Near:    Bilateral Near:     Physical Exam Vitals and nursing note reviewed.  Constitutional:      Appearance: Normal appearance.  HENT:     Head: Normocephalic and atraumatic.  Nose: Nose normal.     Mouth/Throat:     Mouth: Mucous membranes are moist.     Comments: No oral swelling or posterior oropharyngeal swelling Eyes:     Conjunctiva/sclera: Conjunctivae normal.  Cardiovascular:     Rate and Rhythm: Normal rate and regular rhythm.  Pulmonary:     Effort: Pulmonary effort is normal.     Breath sounds: Normal breath sounds.  Musculoskeletal:        General: Normal range of motion.     Cervical back: Normal range of motion.  Skin:    General: Skin is warm and dry.  Neurological:     Mental Status: He is alert.  Psychiatric:        Mood and Affect: Mood normal.      UC Treatments / Results  Labs (all labs ordered are listed, but only abnormal results are displayed) Labs Reviewed - No data to display  EKG   Radiology No results found.  Procedures Procedures (including critical care time)  Medications Ordered in UC Medications - No data to display  Initial Impression / Assessment and Plan / UC Course  I have reviewed the triage vital signs and the nursing  notes.  Pertinent labs & imaging results that were available during my care of the patient were reviewed by me and considered in my medical decision making (see chart for details).     Tongue numbness Mostly likely from sinus infection. The problem seems to be worse with stress and anxiety Most likely inflammation.  No focal neuro deficits Work note given to rest. Finish the medications.  Follow up as needed for continued or worsening symptoms   Final Clinical Impressions(s) / UC Diagnoses   Final diagnoses:  Numbness of tongue     Discharge Instructions     I do not see anything concerning.  Keep taking medication as prescribed.  You can do Mucinex in addition as you asked. Rest, stay in a cool environment and to stay hydrated. Work note given to rest    ED Prescriptions    None     PDMP not reviewed this encounter.   Dahlia Byes A, NP 08/30/19 [phone]

## 2019-09-20 ENCOUNTER — Encounter (HOSPITAL_COMMUNITY): Payer: Self-pay | Admitting: Family Medicine

## 2019-09-20 ENCOUNTER — Ambulatory Visit (HOSPITAL_COMMUNITY)
Admission: EM | Admit: 2019-09-20 | Discharge: 2019-09-20 | Disposition: A | Discharge status: Home / Self Care | Payer: 59 | Attending: Family Medicine | Admitting: Family Medicine

## 2019-09-20 ENCOUNTER — Other Ambulatory Visit: Payer: Self-pay

## 2019-09-20 DIAGNOSIS — K30 Functional dyspepsia: Secondary | ICD-10-CM

## 2019-09-20 DIAGNOSIS — R1013 Epigastric pain: Secondary | ICD-10-CM

## 2019-09-20 DIAGNOSIS — I10 Essential (primary) hypertension: Secondary | ICD-10-CM

## 2019-09-20 HISTORY — DX: Other seasonal allergic rhinitis: J30.2

## 2019-09-20 HISTORY — DX: Essential (primary) hypertension: I10

## 2019-09-20 MED ORDER — AMLODIPINE BESYLATE 5 MG PO TABS: 5.0000 mg | ORAL_TABLET | Freq: Every day | ORAL | 0 refills | Status: AC

## 2019-09-20 MED ORDER — OMEPRAZOLE 40 MG PO CPDR: 40.0000 mg | DELAYED_RELEASE_CAPSULE | Freq: Every day | ORAL | 1 refills | Status: DC

## 2019-09-20 NOTE — ED Provider Notes (Signed)
Brent Benson    CSN: [id number] Arrival date & time: 09/20/19  0805      History   Chief Complaint Chief Complaint  Patient presents with  . abdominal discomfort    HPI Brent Benson. is a 29 y.o. male.   HPI  Patient presents today with a complaint of abdominal discomfort > 1 months. Location right-mid upper quadrant. He endorses sensation of bloating and gas moving around in his abdomen. Symptoms are worse when lying down. Occasionally feels nauseous. No fever, diarrhea, or changes in bowel patterns.  No recent eating habit changes. No prior history of acid reflux. He is passing gas regularly.   Hypertension Take amlodipine for management of hypertension. He is out of medication today and requests a refill. He established with a primary earlier this year, hasn't scheduled follow-up. Intends to schedule follow-up.  No past medical history on file.  Patient Active Problem List   Diagnosis Date Noted  . Hypertension 04/29/2019    No past surgical history on file.     Home Medications    Prior to Admission medications   Medication Sig Start Date End Date Taking? Authorizing Provider  amLODipine (NORVASC) 5 MG tablet Take 1 tablet (5 mg total) by mouth daily. 04/21/19   Vanessa Kick, MD  amoxicillin-clavulanate (AUGMENTIN) 875-125 MG tablet Take 1 tablet by mouth every 12 (twelve) hours. 08/25/19   Loura Halt A, NP  cetirizine (ZYRTEC) 10 MG tablet Take 1 tablet (10 mg total) by mouth daily. 08/25/19   Bast, Tressia Miners A, NP  fluticasone (FLONASE) 50 MCG/ACT nasal spray Place 1 spray into both nostrils daily. 08/25/19   Orvan July, NP    Family History No family history on file.  Social History Social History   Tobacco Use  . Smoking status: Former Smoker    Quit date: 12/28/2018    Years since quitting: 0.7  . Smokeless tobacco: Never Used  Substance Use Topics  . Alcohol use: Yes  . Drug use: No     Allergies   Patient has no known  allergies.   Review of Systems Review of Systems Pertinent negatives listed in HPI Physical Exam Triage Vital Signs ED Triage Vitals  Enc Vitals Group     BP      Pulse      Resp      Temp      Temp src      SpO2      Weight      Height      Head Circumference      Peak Flow      Pain Score      Pain Loc      Pain Edu?      Excl. in [place]?    No data found.  Updated Vital Signs There were no vitals taken for this visit.  Visual Acuity Right Eye Distance:   Left Eye Distance:   Bilateral Distance:    Right Eye Near:   Left Eye Near:    Bilateral Near:     Physical Exam Constitutional: Patient appears well-developed and well-nourished. No distress. CVS: RRR, S1/S2 +, no murmurs, no gallops, no carotid bruit.  Pulmonary: Effort and breath sounds normal, no stridor, rhonchi, wheezes, rales.  Abdominal: Soft. BS +, no distension,mild tenderness with deep palpation (mid right epigastric region) , rebound or guarding.  Musculoskeletal: Normal range of motion. No edema and no tenderness.  Skin: Skin is warm and dry. No rash  noted. Not diaphoretic. No erythema. No pallor. Psychiatric: Normal mood and affect. Behavior, judgment, thought content normal.   UC Treatments / Results  Labs (all labs ordered are listed, but only abnormal results are displayed) Labs Reviewed - No data to display  EKG   Radiology No results found.  Procedures Procedures (including critical care time)  Medications Ordered in UC Medications - No data to display  Initial Impression / Assessment and Plan / UC Course  I have reviewed the triage vital signs and the nursing notes.  Pertinent labs & imaging results that were available during my care of the patient were reviewed by me and considered in my medical decision making (see chart for details).     Abdominal discomfort, suspect acid reflux. Trial Omeprazole 40 mg once daily. If symptoms do not improve follow-up with PCP. If sever  abdominal pain occurs, seek care at the ER.  Hypertension, stable today. Refilled amlodipine. Additional medication refills, see PCP   Final Clinical Impressions(s) / UC Diagnoses   Final diagnoses:  Abdominal discomfort, epigastric  Essential hypertension  Acid indigestion     Discharge Instructions     Schedule follow-up with Primary Care provider for blood pressure management and to follow-up on effectiveness of acid reflux treatment     ED Prescriptions    Medication Sig Dispense Auth. Provider   amLODipine (NORVASC) 5 MG tablet Take 1 tablet (5 mg total) by mouth daily. 30 tablet Bing Neighbors, FNP   omeprazole (PRILOSEC) 40 MG capsule Take 1 capsule (40 mg total) by mouth daily. 30 capsule Bing Neighbors, FNP     PDMP not reviewed this encounter.   Bing Neighbors, [place] 09/20/19 [phone]

## 2019-09-20 NOTE — ED Triage Notes (Signed)
PT states his stomach has been feeling full even though he hasn't ate and when he lays on his stomach thick clear mucous comes up. Pt states he feels gas moves from mid abdomen to right side to flank [id number].

## 2019-09-20 NOTE — Discharge Instructions (Signed)
Schedule follow-up with Primary Care provider for blood pressure management and to follow-up on effectiveness of acid reflux treatment

## 2019-09-22 ENCOUNTER — Encounter (HOSPITAL_COMMUNITY): Payer: Self-pay | Admitting: Emergency Medicine

## 2019-09-22 ENCOUNTER — Emergency Department (HOSPITAL_COMMUNITY)
Admission: EM | Admit: 2019-09-22 | Discharge: 2019-09-22 | Disposition: A | Discharge status: Home / Self Care | Payer: 59 | Attending: Emergency Medicine | Admitting: Emergency Medicine

## 2019-09-22 ENCOUNTER — Other Ambulatory Visit: Payer: Self-pay

## 2019-09-22 DIAGNOSIS — I10 Essential (primary) hypertension: Secondary | ICD-10-CM | POA: Insufficient documentation

## 2019-09-22 DIAGNOSIS — Z87891 Personal history of nicotine dependence: Secondary | ICD-10-CM | POA: Diagnosis not present

## 2019-09-22 DIAGNOSIS — R1013 Epigastric pain: Secondary | ICD-10-CM | POA: Diagnosis present

## 2019-09-22 DIAGNOSIS — K2100 Gastro-esophageal reflux disease with esophagitis, without bleeding: Secondary | ICD-10-CM | POA: Insufficient documentation

## 2019-09-22 LAB — URINALYSIS, ROUTINE W REFLEX MICROSCOPIC
Bacteria, UA: NONE SEEN
Bilirubin Urine: NEGATIVE
Glucose, UA: NEGATIVE mg/dL
Ketones, ur: 5 mg/dL — AB
Leukocytes,Ua: NEGATIVE
Nitrite: NEGATIVE
Protein, ur: >=300 mg/dL — AB
Specific Gravity, Urine: 1.02 (ref 1.005–1.030)
pH: 5 (ref 5.0–8.0)

## 2019-09-22 LAB — COMPREHENSIVE METABOLIC PANEL
ALT: 49 U/L — ABNORMAL HIGH (ref 0–44)
AST: 22 U/L (ref 15–41)
Albumin: 4.6 g/dL (ref 3.5–5.0)
Alkaline Phosphatase: 71 U/L (ref 38–126)
Anion gap: 11 (ref 5–15)
BUN: 13 mg/dL (ref 6–20)
CO2: 24 mmol/L (ref 22–32)
Calcium: 9.3 mg/dL (ref 8.9–10.3)
Chloride: 103 mmol/L (ref 98–111)
Creatinine, Ser: 0.92 mg/dL (ref 0.61–1.24)
GFR calc Af Amer: >60 mL/min (ref >60)
GFR calc non Af Amer: >60 mL/min (ref >60)
Glucose, Bld: 97 mg/dL (ref 70–99)
Potassium: 3.8 mmol/L (ref 3.5–5.1)
Sodium: 138 mmol/L (ref 135–145)
Total Bilirubin: 1.2 mg/dL (ref 0.3–1.2)
Total Protein: 7.4 g/dL (ref 6.5–8.1)

## 2019-09-22 LAB — LIPASE, BLOOD: Lipase: 46 U/L (ref 11–51)

## 2019-09-22 LAB — CBC
HCT: 45.8 % (ref 39.0–52.0)
Hemoglobin: 14.3 g/dL (ref 13.0–17.0)
MCH: 26.3 pg (ref 26.0–34.0)
MCHC: 31.2 g/dL (ref 30.0–36.0)
MCV: 84.2 fL (ref 80.0–100.0)
Platelets: 303 10*3/uL (ref 150–400)
RBC: 5.44 MIL/uL (ref 4.22–5.81)
RDW: 13.7 % (ref 11.5–15.5)
WBC: 7 10*3/uL (ref 4.0–10.5)
nRBC: 0 % (ref 0.0–0.2)

## 2019-09-22 MED ORDER — SODIUM CHLORIDE 0.9% FLUSH: 3.0000 mL | Freq: Once | INTRAVENOUS | Status: DC

## 2019-09-22 NOTE — Discharge Instructions (Signed)
Your EKG and lab work looked normal today.  Everything with your kidneys and liver were normal.  Keep taking the omeprazole at this time but you can also use Maalox or Tums if you eat a large meal or something that you do not agree with and have instant symptoms.

## 2019-09-22 NOTE — ED Triage Notes (Signed)
Patient reports upper abdominal pain / pressure onset last week with nausea , no emesis or diarrhea , no fever or chills.

## 2019-09-22 NOTE — ED Provider Notes (Signed)
[facility] EMERGENCY DEPARTMENT Provider Note   CSN: [id number] Arrival date & time: 09/22/19  8891     History Chief Complaint  Patient presents with  . Abdominal Pain    Brent Benson. is a 29 y.o. male.  Patient is a 29 year old male with a history of hypertension who is presenting today with complaint of upper abdominal pain.  Patient reports that approximately 3 weeks ago he was having a lot of mucus drainage and sinus issues and was treated with antibiotics for a sinus infection.  Shortly after taking the antibiotic he started noticing his stomach issues.  He describes as bloating, increased fullness and seems to be worse when lying down to sleep.  He did see urgent care 2 days ago and was diagnosed with GERD and started on omeprazole.  He has been taking this medication and has noticed some improvement except last night he ate a large meal and reports sleeping was very difficult.  He says he feels the symptoms radiate up through the center of his chest and feels like it sits in his throat.  He has not had any vomiting, fever or change in bowel movement.  He denies any prior abdominal surgeries.  He does intermittently take Alka-Seltzer that has aspirin in it but does not take heavy NSAIDs regularly.  He does not drink alcohol regularly.  The history is provided by the patient.  Abdominal Pain Pain location:  Epigastric Pain quality: bloating, cramping, fullness and pressure   Pain radiates to:  Chest Pain severity:  Moderate Onset quality:  Gradual Duration:  1 week Timing:  Intermittent Progression:  Improving Chronicity:  New Context: recent illness   Context comment:  Was on abx 3 weeks ago for sinus infection and then in the last 1-2 weeks having abd issues Relieved by:  Belching and position changes Exacerbated by: lying down and large meals. Ineffective treatments:  None tried Associated symptoms: anorexia, belching and chest pain     Associated symptoms: no cough, no diarrhea, no dysuria, no fever, no nausea, no shortness of breath and no vomiting   Risk factors: obesity   Risk factors: no alcohol abuse   Risk factors comment:  Htn      Past Medical History:  Diagnosis Date  . Hypertension   . Seasonal allergies     Patient Active Problem List   Diagnosis Date Noted  . Hypertension 04/29/2019    History reviewed. No pertinent surgical history.     Family History  Problem Relation Age of Onset  . Hypertension Mother   . Diabetes Father   . Hypertension Father     Social History   Tobacco Use  . Smoking status: Former Smoker    Quit date: 12/28/2018    Years since quitting: 0.7  . Smokeless tobacco: Never Used  Substance Use Topics  . Alcohol use: Yes    Comment: occ  . Drug use: No    Home Medications Prior to Admission medications   Medication Sig Start Date End Date Taking? Authorizing Provider  amLODipine (NORVASC) 5 MG tablet Take 1 tablet (5 mg total) by mouth daily. 09/20/19  Yes Scot Jun, FNP  omeprazole (PRILOSEC) 40 MG capsule Take 1 capsule (40 mg total) by mouth daily. 09/20/19  Yes Scot Jun, FNP    Allergies    Patient has no known allergies.  Review of Systems   Review of Systems  Constitutional: Negative for fever.  Respiratory: Negative for  cough and shortness of breath.   Cardiovascular: Positive for chest pain.  Gastrointestinal: Positive for abdominal pain and anorexia. Negative for diarrhea, nausea and vomiting.  Genitourinary: Negative for dysuria.  All other systems reviewed and are negative.   Physical Exam Updated Vital Signs BP 125/73 (BP Location: Right Arm)   Pulse 62   Temp 98.1 F (36.7 C) (Oral)   Resp 16   Ht 5\' 10"  (1.778 m)   Wt 122.5 kg   SpO2 99%   BMI 38.74 kg/m   Physical Exam Vitals and nursing note reviewed.  Constitutional:      General: He is not in acute distress.    Appearance: He is well-developed. He is  obese.  HENT:     Head: Normocephalic and atraumatic.     Mouth/Throat:     Mouth: Mucous membranes are moist.  Eyes:     Conjunctiva/sclera: Conjunctivae normal.     Pupils: Pupils are equal, round, and reactive to light.  Cardiovascular:     Rate and Rhythm: Normal rate and regular rhythm.     Pulses: Normal pulses.     Heart sounds: No murmur heard.   Pulmonary:     Effort: Pulmonary effort is normal. No respiratory distress.     Breath sounds: Normal breath sounds. No wheezing or rales.  Abdominal:     General: There is no distension.     Palpations: Abdomen is soft.     Tenderness: There is abdominal tenderness in the right upper quadrant and epigastric area. There is no right CVA tenderness, left CVA tenderness, guarding or rebound. Negative signs include Murphy's sign.  Musculoskeletal:        General: No tenderness. Normal range of motion.     Cervical back: Normal range of motion and neck supple.  Skin:    General: Skin is warm and dry.     Findings: No erythema or rash.  Neurological:     General: No focal deficit present.     Mental Status: He is alert and oriented to person, place, and time. Mental status is at baseline.  Psychiatric:        Mood and Affect: Mood normal.        Behavior: Behavior normal.        Thought Content: Thought content normal.      ED Results / Procedures / Treatments   Labs (all labs ordered are listed, but only abnormal results are displayed) Labs Reviewed  COMPREHENSIVE METABOLIC PANEL - Abnormal; Notable for the following components:      Result Value   ALT 49 (*)    All other components within normal limits  URINALYSIS, ROUTINE W REFLEX MICROSCOPIC - Abnormal; Notable for the following components:   Hgb urine dipstick SMALL (*)    Ketones, ur 5 (*)    Protein, ur >=300 (*)    All other components within normal limits  LIPASE, BLOOD  CBC    EKG EKG Interpretation  Date/Time:  Wednesday September 22 2019 08:19:34  EDT Ventricular Rate:  55 PR Interval:    QRS Duration: 101 QT Interval:  400 QTC Calculation: 383 R Axis:   81 Text Interpretation: Sinus arrhythmia Borderline repolarization abnormality No previous tracing Confirmed by [id number] (Gwyneth Sprout) on 09/22/2019 8:46:09 AM   Radiology No results found.  Procedures Procedures (including critical care time)  Medications Ordered in ED Medications  sodium chloride flush (NS) 0.9 % injection 3 mL (has no administration in time range)  ED Course  I have reviewed the triage vital signs and the nursing notes.  Pertinent labs & imaging results that were available during my care of the patient were reviewed by me and considered in my medical decision making (see chart for details).    MDM Rules/Calculators/A&P                          29 year old male presenting today with symptoms most classic for reflux disease and possible mild gastritis.  All of patient's symptoms started after being on antibiotics for sinus infection.  He does not take excessive NSAIDs, drink excessive amounts of alcohol or have other risk factors for peptic ulcer disease and low concern for cirrhosis or GI bleeding.  Patient does have some mild right upper quadrant tenderness and cannot 100% rule out cholelithiasis but given patient's symptoms and complaints seems less likely.  Did caution patient if symptoms do not continue to improve with omeprazole and go away he may need to follow-up with his doctor for right upper quadrant ultrasound.  Patient does report he ran out of his blood pressure medication 3 or 4 days ago and needs to call his doctor for refill but blood pressure today is 125/73.  He is in no acute distress and has no peritoneal findings on exam.  EKG without acute findings and low suspicion for ACS, dissection, PE or angina. Low suspicion for pancreatitis, appendicitis, hepatitis or diverticulitis.  Labs are reassuring with normal renal function, LFTs mostly  normal with only minimal elevation of ALT at 49 and total bilirubin wnl.  Lipase and CBC also within normal limits.  Will have patient continue omeprazole as he has only been taking it for 2 days and use Tums or Mylanta/Maalox as needed for discomfort as well as diet changes.  Encourage patient to follow-up with his doctor in 2 weeks if not improved.  MDM Number of Diagnoses or Management Options   Amount and/or Complexity of Data Reviewed Clinical lab tests: ordered and reviewed Tests in the medicine section of CPT: ordered and reviewed Decide to obtain previous medical records or to obtain history from someone other than the patient: yes Obtain history from someone other than the patient: no Review and summarize past medical records: yes Independent visualization of images, tracings, or specimens: yes  Risk of Complications, Morbidity, and/or Mortality Presenting problems: moderate Diagnostic procedures: minimal Management options: minimal  Patient Progress Patient progress: stable  Final Clinical Impression(s) / ED Diagnoses Final diagnoses:  Gastroesophageal reflux disease with esophagitis without hemorrhage    Rx / DC Orders ED Discharge Orders    None       Gwyneth Sprout, MD 09/22/19 [phone]

## 2019-09-22 NOTE — ED Notes (Signed)
Pt stated he wanted to wait until he got in a room before giving anymore labs.

## 2019-09-22 NOTE — ED Notes (Signed)
ED Provider at bedside. 

## 2019-10-12 ENCOUNTER — Other Ambulatory Visit: Payer: Self-pay | Admitting: Family Medicine

## 2019-10-25 ENCOUNTER — Other Ambulatory Visit: Payer: Self-pay | Admitting: Family Medicine

## 2019-11-09 ENCOUNTER — Ambulatory Visit (INDEPENDENT_AMBULATORY_CARE_PROVIDER_SITE_OTHER): Payer: 59 | Admitting: Internal Medicine

## 2019-11-09 ENCOUNTER — Encounter: Payer: Self-pay | Admitting: Internal Medicine

## 2019-11-09 VITALS — BP 153/89 | HR 66 | Temp 99.4°F | Ht 70.0 in | Wt 259.5 lb

## 2019-11-09 DIAGNOSIS — R1013 Epigastric pain: Secondary | ICD-10-CM | POA: Diagnosis not present

## 2019-11-09 DIAGNOSIS — I10 Essential (primary) hypertension: Secondary | ICD-10-CM

## 2019-11-09 DIAGNOSIS — R800 Isolated proteinuria: Secondary | ICD-10-CM

## 2019-11-09 DIAGNOSIS — R109 Unspecified abdominal pain: Secondary | ICD-10-CM | POA: Insufficient documentation

## 2019-11-09 DIAGNOSIS — R7303 Prediabetes: Secondary | ICD-10-CM | POA: Insufficient documentation

## 2019-11-09 DIAGNOSIS — R809 Proteinuria, unspecified: Secondary | ICD-10-CM | POA: Insufficient documentation

## 2019-11-09 LAB — POCT GLYCOSYLATED HEMOGLOBIN (HGB A1C): Hemoglobin A1C: 5.5 % (ref 4.0–5.6)

## 2019-11-09 LAB — GLUCOSE, CAPILLARY: Glucose-Capillary: 92 mg/dL (ref 70–99)

## 2019-11-09 NOTE — Assessment & Plan Note (Signed)
Hemoglobin A1c 5.8 1/21, will recheck today. Denies polyuria and polydipsia.  Plan: - Check Hgb A1c

## 2019-11-09 NOTE — Assessment & Plan Note (Signed)
Abdominal Pain Patients abdominal pain started in April and he was seen at an urgent care in June. He describes bloating and gas in the epigastric region which has moved around to both upper quadrants. The pain comes on 4 hours after a meal and associates it with high protein or fatty foods. He has daily bowel movements and some occasional constipation. The pain is releived with defacation sometimes. The pain did not get better with a month of Prilosec. The pain is 6/10.   Assessment: Possible patient has biliary colic. Although I expect this to come on much sooner after eating. No abnormal lab findings recently and would not change current management so will defer rechecking. Plan: RUQ [place] Follow up after imaging.

## 2019-11-09 NOTE — Assessment & Plan Note (Signed)
Hx of HTN and prediabetic. On review of recent lab work, 6/16 patient had > 3 grams of protein on UA. Will further evaluate with UA and microalbumin/creatinine ration today.

## 2019-11-09 NOTE — Progress Notes (Signed)
   CC: Abdominal pain   HPI:Mr.Brent Benson. is a 29 y.o. male who presents for evaluation of abdominal pain. Please see individual problem based A/P for details.    Past Medical History:  Diagnosis Date  . Hypertension   . Seasonal allergies    Review of Systems:  ROS negative except as per HPI.  Physical Exam: Vitals:   11/09/19 1440  BP: (!) 153/89  Pulse: 66  Temp: 99.4 F (37.4 C)  TempSrc: Oral  SpO2: 100%  Weight: 259 lb 8 oz (117.7 kg)  Height: 5\' 10"  (1.778 m)    General: NAD, nl appearance HE: Normocephalic, atraumatic , EOMI, Conjunctivae normal ENT: No congestion, no rhinorrhea, no exudate or erythema  Cardiovascular: Normal rate, regular rhythm.  No murmurs, rubs, or gallops Pulmonary : Effort normal, breath sounds normal. No wheezes, rales, or rhonchi Abdominal: soft, nontender,  bowel sounds present Musculoskeletal: no swelling , deformity, injury ,or tenderness in extremities, Skin: Warm, dry , no bruising, erythema, or rash Psychiatric/Behavioral:  normal mood, normal behavior   Assessment & Plan:   See Encounters Tab for problem based charting.  Patient discussed with Dr. 

## 2019-11-09 NOTE — Assessment & Plan Note (Signed)
  HTN: BP Readings from Last 3 Encounters:  11/09/19 (!) 153/89  09/22/19 125/73  09/20/19 130/79   Patient has stopped taking amlodopine 5 mg and reports his BP is 120/80 at home. Patient says coming to the doctor and his stomach problems are making him nervous. I asked patient to take one week of BP readings at home before next visit. We will discuss restarting amlodipine at this time.

## 2019-11-09 NOTE — Patient Instructions (Signed)
Thank you for trusting me with your care. To recap, today we discussed the following:   1. Isolated proteinuria   - Microalbumin / Creatinine Urine Ratio - Urinalysis, Complete (81001)  2. Epigastric pain  - US Abdomen Limited RUQ; Future  3. Prediabetes  - Hemoglobin A1c  I will follow up if your labs are abnormal. You will be scheduled for follow up visit after your ultrasound.   My best,  Thurmon Fair, MD

## 2019-11-10 LAB — URINALYSIS, COMPLETE
Bilirubin, UA: NEGATIVE
Glucose, UA: NEGATIVE
Ketones, UA: NEGATIVE
Leukocytes,UA: NEGATIVE
Nitrite, UA: NEGATIVE
RBC, UA: NEGATIVE
Specific Gravity, UA: 1.02 (ref 1.005–1.030)
Urobilinogen, Ur: 1 mg/dL (ref 0.2–1.0)
pH, UA: 6 (ref 5.0–7.5)

## 2019-11-10 LAB — MICROSCOPIC EXAMINATION
Bacteria, UA: NONE SEEN
Casts: NONE SEEN /lpf
Epithelial Cells (non renal): NONE SEEN /hpf (ref 0–10)

## 2019-11-10 LAB — MICROALBUMIN / CREATININE URINE RATIO
Creatinine, Urine: 159.5 mg/dL
Microalb/Creat Ratio: 41 mg/g creat — ABNORMAL HIGH (ref 0–29)
Microalbumin, Urine: 64.7 ug/mL

## 2019-11-11 ENCOUNTER — Ambulatory Visit (HOSPITAL_COMMUNITY)
Admission: EM | Admit: 2019-11-11 | Discharge: 2019-11-11 | Disposition: A | Discharge status: Home / Self Care | Payer: 59 | Attending: Family Medicine | Admitting: Family Medicine

## 2019-11-11 ENCOUNTER — Encounter (HOSPITAL_COMMUNITY): Payer: Self-pay

## 2019-11-11 ENCOUNTER — Other Ambulatory Visit: Payer: Self-pay

## 2019-11-11 DIAGNOSIS — Z20822 Contact with and (suspected) exposure to covid-19: Secondary | ICD-10-CM | POA: Insufficient documentation

## 2019-11-11 LAB — SARS CORONAVIRUS 2 (TAT 6-24 HRS): SARS Coronavirus 2: NEGATIVE

## 2019-11-11 NOTE — ED Triage Notes (Signed)
Pt coworker tested positive for COVID, exposed last week

## 2019-11-11 NOTE — Progress Notes (Signed)
Internal Medicine Clinic Attending  Case discussed with Dr. Steen  At the time of the visit.  We reviewed the resident's history and exam and pertinent patient test results.  I agree with the assessment, diagnosis, and plan of care documented in the resident's note.  

## 2019-11-11 NOTE — ED Provider Notes (Signed)
[facility]   [id number] 11/11/19 Arrival Time: 1226  ASSESSMENT & PLAN:  1. Exposure to COVID-19 virus      COVID-19 testing sent. See letter/work note on file for self-isolation guidelines.    Follow-up Information    Albertha Ghee, MD.   Specialty: Internal Medicine Why: As needed. Contact information: [address] [place] [address] [phone]               Reviewed expectations re: course of current medical issues. Questions answered. Outlined signs and symptoms indicating need for more acute intervention. Understanding verbalized. After Visit Summary given.   SUBJECTIVE: History from: patient. Brent Benson. is a 29 y.o. male who requests COVID-19 testing. Known COVID-19 contact: reports co-worker tested positive. Recent travel: none. Denies: runny nose, congestion, fever, cough, sore throat, difficulty breathing and headache. Normal PO intake without n/v/d.    OBJECTIVE:  Vitals:   11/11/19 1346 11/11/19 1349  BP: (!) 143/82   Pulse: (!) 59   Resp: 18   Temp: 98.7 F (37.1 C)   SpO2: (!) 9% 99%    General appearance: alert; no distress Eyes: PERRLA; EOMI; conjunctiva normal HENT: [place]; AT; nasal mucosa normal; oral mucosa normal Neck: supple  Lungs: speaks full sentences without difficulty; unlabored Extremities: no edema Skin: warm and dry Neurologic: normal gait Psychological: alert and cooperative; normal mood and affect  Labs:  Labs Reviewed  SARS CORONAVIRUS 2 (TAT 6-24 HRS)      No Known Allergies  Past Medical History:  Diagnosis Date  . Hypertension   . Seasonal allergies    Social History   Socioeconomic History  . Marital status: Single    Spouse name: Not on file  . Number of children: Not on file  . Years of education: Not on file  . Highest education level: Not on file  Occupational History  . Not on file  Tobacco Use  . Smoking status: Former Smoker    Quit date:  12/28/2018    Years since quitting: 0.8  . Smokeless tobacco: Never Used  Substance and Sexual Activity  . Alcohol use: Yes    Comment: occ  . Drug use: No  . Sexual activity: Not on file  Other Topics Concern  . Not on file  Social History Narrative  . Not on file   Social Determinants of Health   Financial Resource Strain:   . Difficulty of Paying Living Expenses:   Food Insecurity:   . Worried About Programme researcher, broadcasting/film/video in the Last Year:   . Barista in the Last Year:   Transportation Needs:   . Freight forwarder (Medical):   Marland Kitchen Lack of Transportation (Non-Medical):   Physical Activity:   . Days of Exercise per Week:   . Minutes of Exercise per Session:   Stress:   . Feeling of Stress :   Social Connections:   . Frequency of Communication with Friends and Family:   . Frequency of Social Gatherings with Friends and Family:   . Attends Religious Services:   . Active Member of Clubs or Organizations:   . Attends Banker Meetings:   Marland Kitchen Marital Status:   Intimate Partner Violence:   . Fear of Current or Ex-Partner:   . Emotionally Abused:   Marland Kitchen Physically Abused:   . Sexually Abused:    Family History  Problem Relation Age of Onset  . Hypertension Mother   . Diabetes Father   .  Hypertension Father    History reviewed. No pertinent surgical history.   [name], MD 11/11/19 1359

## 2019-11-11 NOTE — Discharge Instructions (Addendum)
You have been tested for COVID-19 today. °If your test returns positive, you will receive a phone call from [facility] regarding your results. °Negative test results are not called. °Both positive and negative results area always visible on MyChart. °If you do not have a MyChart account, sign up instructions are provided in your discharge papers. °Please do not hesitate to contact us should you have questions or concerns. ° °

## 2019-11-15 ENCOUNTER — Other Ambulatory Visit: Payer: Self-pay

## 2019-11-15 ENCOUNTER — Ambulatory Visit (HOSPITAL_COMMUNITY)
Admission: RE | Admit: 2019-11-15 | Discharge: 2019-11-15 | Disposition: A | Discharge status: Home / Self Care | Payer: 59 | Source: Ambulatory Visit | Attending: Internal Medicine | Admitting: Internal Medicine

## 2019-11-15 DIAGNOSIS — R1013 Epigastric pain: Secondary | ICD-10-CM | POA: Insufficient documentation

## 2019-12-07 ENCOUNTER — Encounter: Payer: 59 | Admitting: Internal Medicine

## 2019-12-07 ENCOUNTER — Telehealth: Payer: Self-pay | Admitting: *Deleted

## 2019-12-07 NOTE — Telephone Encounter (Signed)
Pt did not come to his appt this afternoon. Called pt to re-schedule, no answer - mailbox full, unable to leave a message.

## 2020-01-02 ENCOUNTER — Ambulatory Visit (HOSPITAL_COMMUNITY)
Admission: EM | Admit: 2020-01-02 | Discharge: 2020-01-02 | Disposition: A | Discharge status: Home / Self Care | Payer: 59 | Attending: Urgent Care | Admitting: Urgent Care

## 2020-01-02 ENCOUNTER — Other Ambulatory Visit: Payer: Self-pay

## 2020-01-02 ENCOUNTER — Encounter (HOSPITAL_COMMUNITY): Payer: Self-pay

## 2020-01-02 DIAGNOSIS — R42 Dizziness and giddiness: Secondary | ICD-10-CM | POA: Diagnosis present

## 2020-01-02 DIAGNOSIS — Z833 Family history of diabetes mellitus: Secondary | ICD-10-CM | POA: Insufficient documentation

## 2020-01-02 DIAGNOSIS — Z87891 Personal history of nicotine dependence: Secondary | ICD-10-CM | POA: Insufficient documentation

## 2020-01-02 DIAGNOSIS — Z8249 Family history of ischemic heart disease and other diseases of the circulatory system: Secondary | ICD-10-CM | POA: Diagnosis not present

## 2020-01-02 DIAGNOSIS — R5383 Other fatigue: Secondary | ICD-10-CM | POA: Insufficient documentation

## 2020-01-02 DIAGNOSIS — I10 Essential (primary) hypertension: Secondary | ICD-10-CM | POA: Insufficient documentation

## 2020-01-02 DIAGNOSIS — Z79899 Other long term (current) drug therapy: Secondary | ICD-10-CM | POA: Diagnosis not present

## 2020-01-02 DIAGNOSIS — Z20822 Contact with and (suspected) exposure to covid-19: Secondary | ICD-10-CM | POA: Diagnosis not present

## 2020-01-02 MED ORDER — MECLIZINE HCL 12.5 MG PO TABS: 12.5000 mg | ORAL_TABLET | Freq: Three times a day (TID) | ORAL | 0 refills | Status: AC | PRN

## 2020-01-02 NOTE — Discharge Instructions (Addendum)

## 2020-01-02 NOTE — ED Triage Notes (Signed)
Pt reports having lightheaded when standing and sitting for the past 3 days. Denies vision changes, nausea, headache. Pt requested COVID test.

## 2020-01-02 NOTE — ED Provider Notes (Signed)
[facility]   MRN: [id number] DOB: 25-Jan-1991  Subjective:   Brent Benson. is a 29 y.o. male presenting for 3 day hx of acute onset intermittent dizziness, mild occasional fatigue.  Patient is worried that he thinks there might have been an exhaust leak in his car and wonders if he has carbon monoxide poisoning.  Denies headache, confusion, lethargy, nausea, vomiting.  Has not tried medications for relief.  He works third shift.  Has a history of high blood pressure, takes amlodipine for this.  No current facility-administered medications for this encounter.  Current Outpatient Medications:  .  amLODipine (NORVASC) 5 MG tablet, Take 1 tablet (5 mg total) by mouth daily., Disp: 30 tablet, Rfl: 0   No Known Allergies  Past Medical History:  Diagnosis Date  . Hypertension   . Seasonal allergies      No past surgical history on file.  Family History  Problem Relation Age of Onset  . Hypertension Mother   . Diabetes Father   . Hypertension Father     Social History   Tobacco Use  . Smoking status: Former Smoker    Quit date: 12/28/2018    Years since quitting: 1.0  . Smokeless tobacco: Never Used  Substance Use Topics  . Alcohol use: Yes    Comment: occ  . Drug use: No    ROS   Objective:   Vitals: BP (!) 142/87 (BP Location: Right Arm)   Pulse 65   Temp 98.8 F (37.1 C) (Oral)   Resp 19   SpO2 100%   Physical Exam Constitutional:      General: He is not in acute distress.    Appearance: Normal appearance. He is well-developed and normal weight. He is not ill-appearing, toxic-appearing or diaphoretic.  HENT:     Head: Normocephalic and atraumatic.     Right Ear: External ear normal.     Left Ear: External ear normal.     Nose: Nose normal.     Mouth/Throat:     Pharynx: Oropharynx is clear.  Eyes:     General: No scleral icterus.       Right eye: No discharge.        Left eye: No discharge.     Extraocular Movements:  Extraocular movements intact.     Pupils: Pupils are equal, round, and reactive to light.  Cardiovascular:     Rate and Rhythm: Normal rate.  Pulmonary:     Effort: Pulmonary effort is normal.  Musculoskeletal:     Cervical back: Normal range of motion.  Skin:    General: Skin is warm and dry.  Neurological:     Mental Status: He is alert and oriented to person, place, and time.     Cranial Nerves: No cranial nerve deficit.     Motor: No weakness.     Coordination: Coordination normal.     Gait: Gait normal.     Deep Tendon Reflexes: Reflexes normal.  Psychiatric:        Mood and Affect: Mood normal.        Behavior: Behavior normal.        Thought Content: Thought content normal.        Judgment: Judgment normal.      Assessment and Plan :   PDMP not reviewed this encounter.  1. Dizziness     Low suspicion for carbon monoxide poisoning.  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  Recommended healthy diet, supportive care and meclizine as needed for his dizziness. Counseled patient on potential for adverse effects with medications prescribed/recommended today, ER and return-to-clinic precautions discussed, patient verbalized understanding.    [name], PA-C 01/02/20 1910

## 2020-01-03 LAB — SARS CORONAVIRUS 2 (TAT 6-24 HRS): SARS Coronavirus 2: NEGATIVE

## 2020-03-10 ENCOUNTER — Other Ambulatory Visit: Payer: Self-pay | Admitting: Family Medicine

## 2020-03-10 DIAGNOSIS — R1013 Epigastric pain: Secondary | ICD-10-CM

## 2020-03-10 DIAGNOSIS — R142 Eructation: Secondary | ICD-10-CM

## 2020-04-06 ENCOUNTER — Ambulatory Visit
Admission: RE | Admit: 2020-04-06 | Discharge: 2020-04-06 | Disposition: A | Discharge status: Home / Self Care | Payer: 59 | Source: Ambulatory Visit | Attending: Family Medicine | Admitting: Family Medicine

## 2020-04-06 DIAGNOSIS — R142 Eructation: Secondary | ICD-10-CM

## 2020-04-06 DIAGNOSIS — R1013 Epigastric pain: Secondary | ICD-10-CM

## 2020-05-17 ENCOUNTER — Other Ambulatory Visit: Payer: Self-pay | Admitting: Family Medicine

## 2020-05-17 NOTE — Telephone Encounter (Signed)
Called pt - no answer; left message to call the office to schedule an appt. I will send message to the front office "Please schedule appointment with PCP in about 1 week and ask patient to bring BP log per Dr Imogene Burn.

## 2020-05-17 NOTE — Telephone Encounter (Signed)
Attempted to call patient about this but did not answer phone. According to last note, patient was not taking this and had reported normotensive home BP measurements. Planned for follow up with home BP log but no-showed afterwards. Please schedule appointment with PCP in about 1 week and ask patient to bring BP log

## 2020-10-03 IMAGING — US US ABDOMEN LIMITED
1 series · 14 of 25 positions shown · non-contrast
Comparison: None.

CLINICAL DATA: Intermittent epigastric pain since July 2019.

EXAM:
ULTRASOUND ABDOMEN LIMITED RIGHT UPPER QUADRANT

[Series 1: us abdomen limited ruq · 14 of 45 slices shown]
[im 1/45]
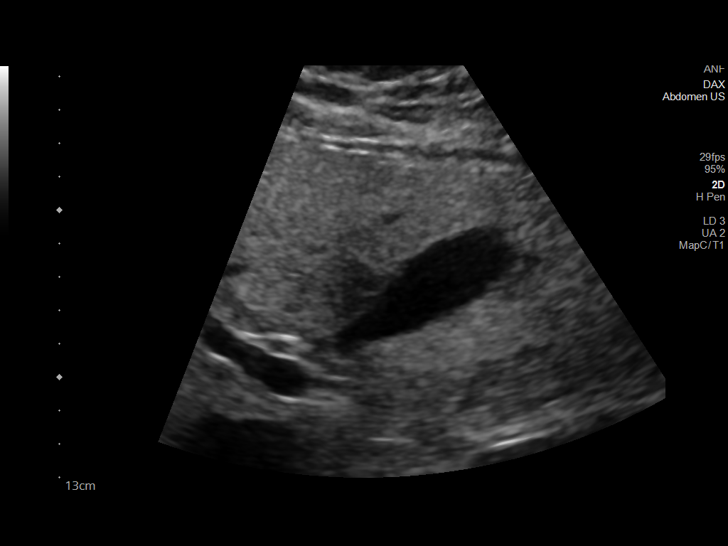
[im 4/45]
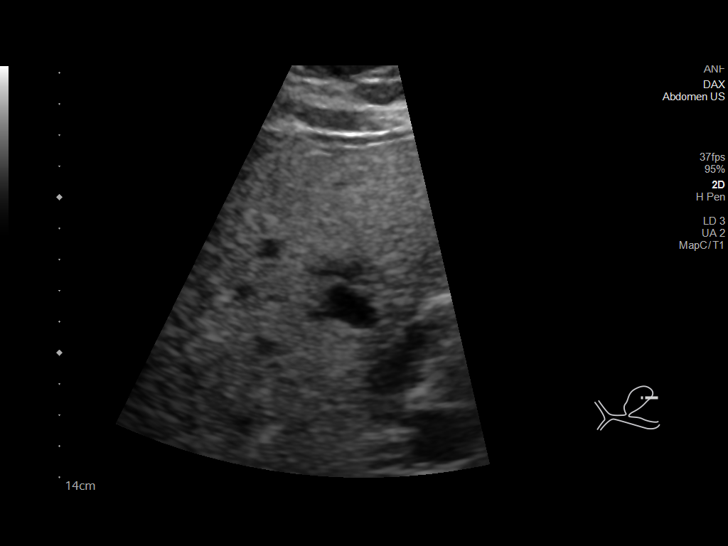
[im 8/45]
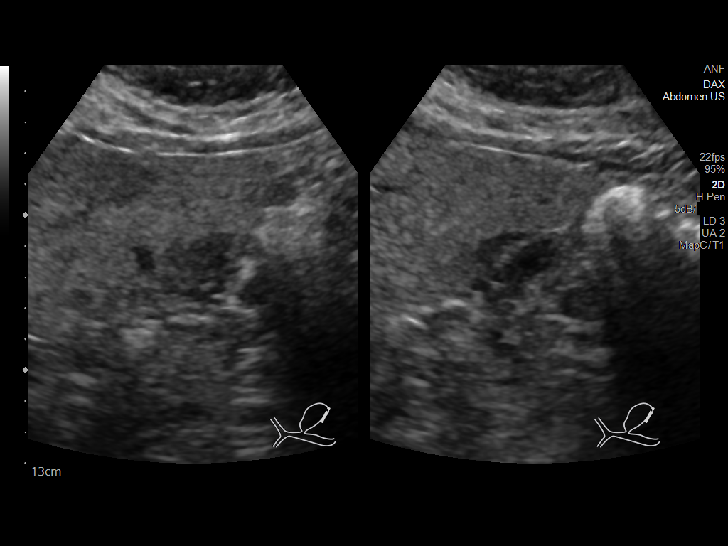
[im 12/45]
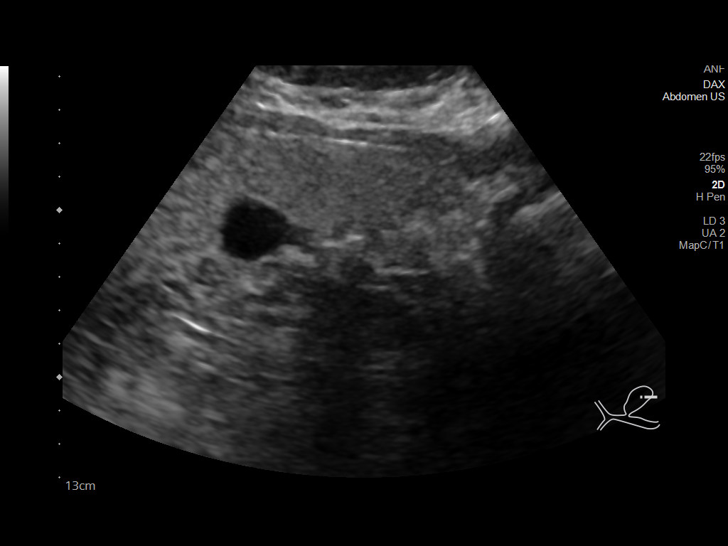
[im 15/45]
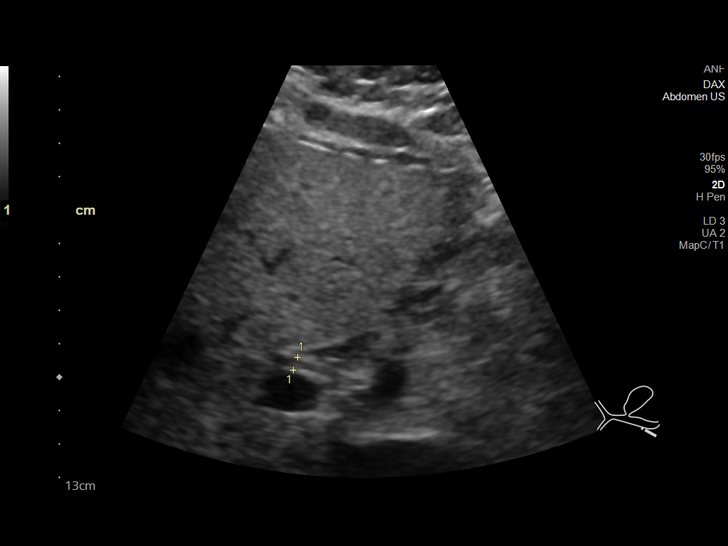
[im 17/45]
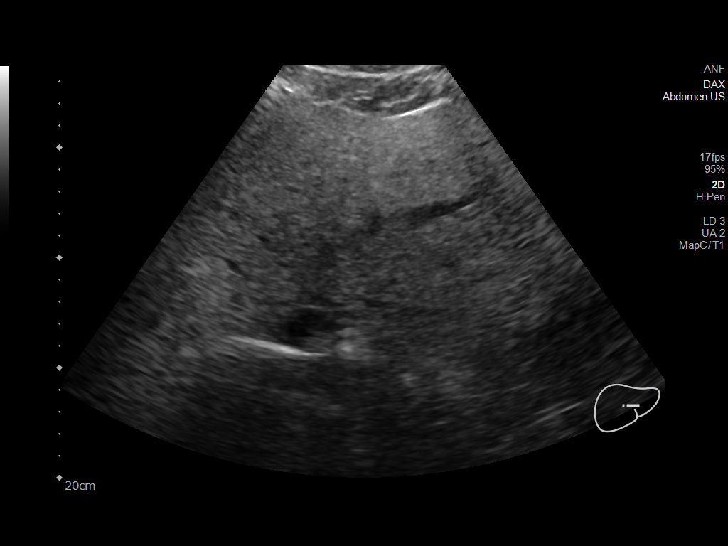
[im 21/45]
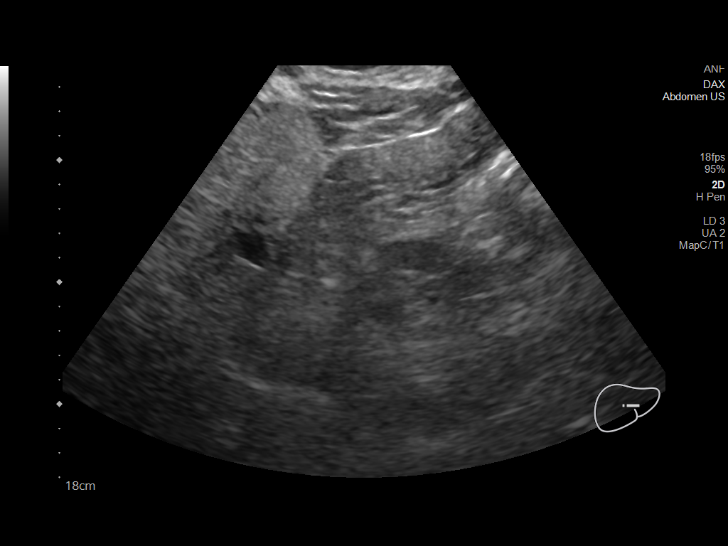
[im 24/45]
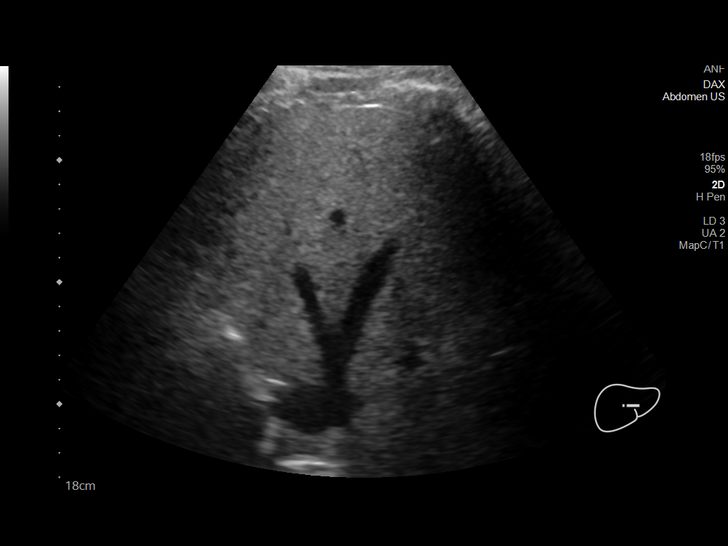
[im 28/45]
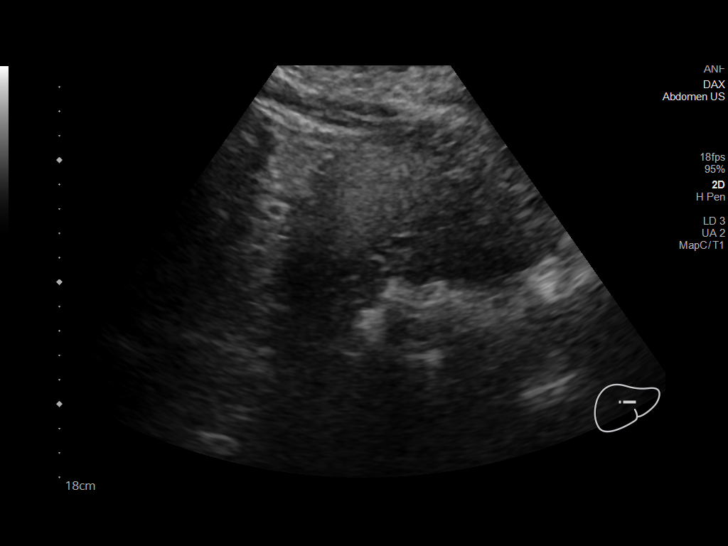
[im 30/45]
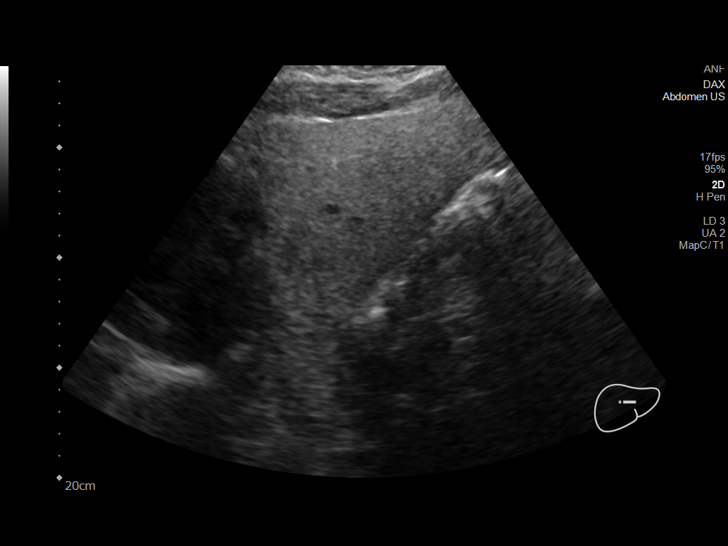
[im 34/45]
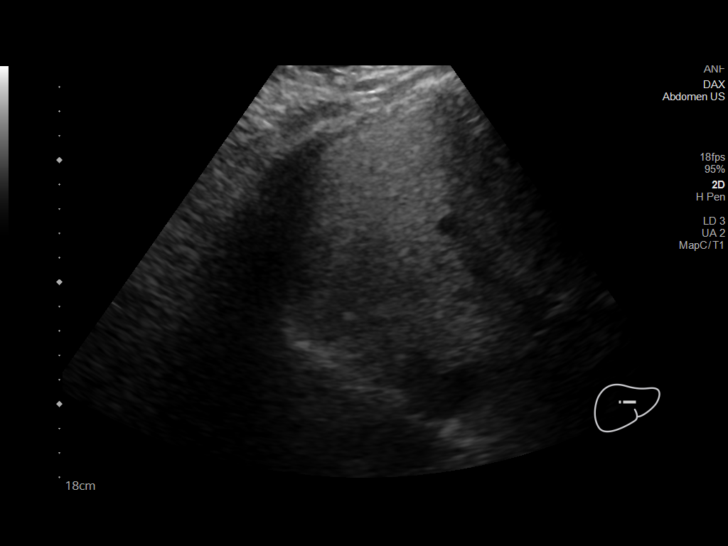
[im 37/45]
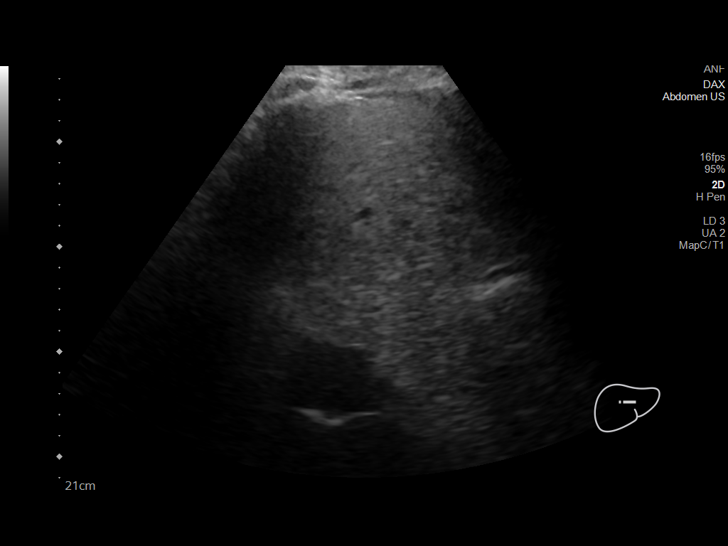
[im 41/45]
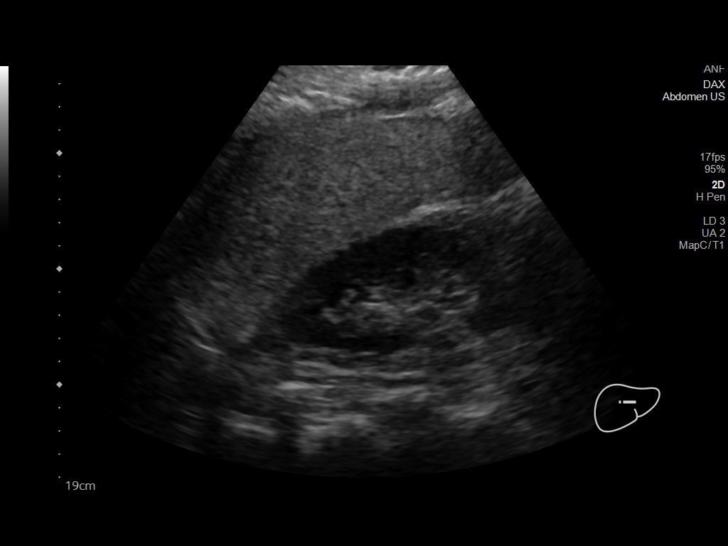
[im 45/45]
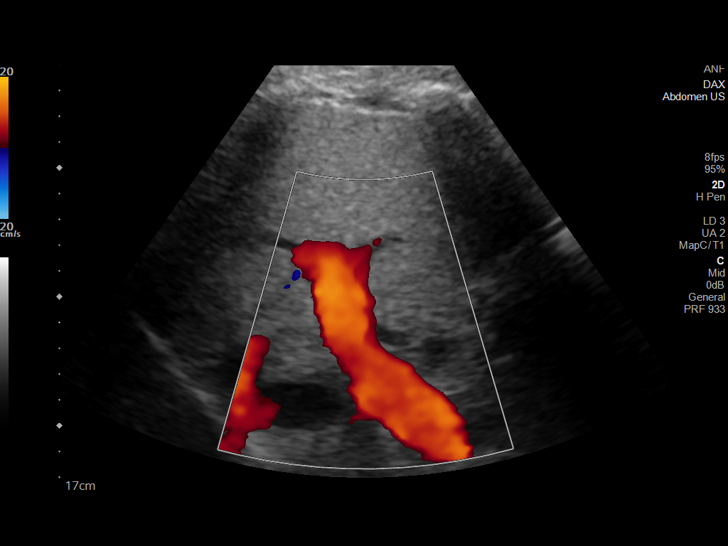

[14 of 25 positions shown; findings below may reference images not displayed]

FINDINGS: Gallbladder:

No gallstones or wall thickening visualized. No sonographic Murphy
sign noted by sonographer.

Common bile duct:

Diameter: 0.4 cm

Liver:

Echogenicity is increased consistent with fatty infiltration. Portal
vein is patent on color Doppler imaging with normal direction of
blood flow towards the liver.

Other: None.
IMPRESSION: Negative for gallstones or acute abnormality.

Fatty infiltration of the liver.

## 2020-10-10 ENCOUNTER — Encounter: Payer: Self-pay | Admitting: *Deleted

## 2023-12-02 ENCOUNTER — Ambulatory Visit
Admission: EM | Admit: 2023-12-02 | Discharge: 2023-12-02 | Disposition: A | Discharge status: Home / Self Care | Payer: Self-pay | Attending: Emergency Medicine | Admitting: Emergency Medicine

## 2023-12-02 ENCOUNTER — Encounter: Payer: Self-pay | Admitting: Emergency Medicine

## 2023-12-02 DIAGNOSIS — K219 Gastro-esophageal reflux disease without esophagitis: Secondary | ICD-10-CM

## 2023-12-02 MED ORDER — OMEPRAZOLE 20 MG PO CPDR: 20.0000 mg | DELAYED_RELEASE_CAPSULE | Freq: Every day | ORAL | 1 refills | Status: AC

## 2023-12-02 MED ORDER — ALUM & MAG HYDROXIDE-SIMETH 200-200-20 MG/5ML PO SUSP
30.0000 mL | Freq: Once | ORAL | Status: AC
  Administered 2023-12-02: 30 mL via ORAL

## 2023-12-02 MED ORDER — ALUMINUM-MAGNESIUM-SIMETHICONE 200-200-20 MG/5ML PO SUSP: 30.0000 mL | Freq: Three times a day (TID) | ORAL | 0 refills | Status: AC

## 2023-12-02 MED ORDER — LIDOCAINE VISCOUS HCL 2 % MT SOLN
15.0000 mL | Freq: Once | OROMUCOSAL | Status: AC
  Administered 2023-12-02: 15 mL via OROMUCOSAL

## 2023-12-02 NOTE — ED Provider Notes (Signed)
 [name]    CSN: [id number] Arrival date & time: 12/02/23  1443      History   Chief Complaint No chief complaint on file.   HPI Brent Benson. is a 33 y.o. male.   Patient presents for patient of abdominal bloating, increased gas production, nausea without vomiting, heartburn and indigestion, pain to the top in the lower abdomen beginning this morning.  Progressively worsening.  Endorses that this is the second occurrence after eating red meat, had a hamburger for dinner last week.  Approximately 3 weeks ago ate a steak and had similar symptoms which lasted for 1 to 2 weeks before resolution.  Has attempted use of Tums and lemon water which is intermittently helpful.  Denies fever, diarrhea or constipation, URI symptoms.  Past Medical History:  Diagnosis Date   Hypertension    Seasonal allergies     Patient Active Problem List   Diagnosis Date Noted   Proteinuria 11/09/2019   Abdominal pain 11/09/2019   Prediabetes 11/09/2019   Hypertension 04/29/2019    History reviewed. No pertinent surgical history.     Home Medications    Prior to Admission medications   Medication Sig Start Date End Date Taking? Authorizing Provider  aluminum -magnesium  hydroxide-simethicone  (MAALOX) 200-200-20 MG/5ML SUSP Take 30 mLs by mouth 4 (four) times daily -  before meals and at bedtime. 12/02/23  Yes Keasia Dubose R, NP  omeprazole  (PRILOSEC) 20 MG capsule Take 1 capsule (20 mg total) by mouth daily. 12/02/23  Yes Mattingly Fountaine R, NP  amLODipine  (NORVASC ) 5 MG tablet Take 1 tablet (5 mg total) by mouth daily. 09/20/19   Arloa Suzen RAMAN, NP  meclizine  (ANTIVERT ) 12.5 MG tablet Take 1 tablet (12.5 mg total) by mouth 3 (three) times daily as needed for dizziness. 01/02/20   Christopher Savannah, PA-C    Family History Family History  Problem Relation Age of Onset   Hypertension Mother    Diabetes Father    Hypertension Father     Social History Social History    Tobacco Use   Smoking status: Former    Current packs/day: 0.00    Types: Cigarettes    Quit date: 12/28/2018    Years since quitting: 4.9   Smokeless tobacco: Never  Substance Use Topics   Alcohol use: Yes    Comment: occ   Drug use: No     Allergies   Patient has no known allergies.   Review of Systems Review of Systems   Physical Exam Triage Vital Signs ED Triage Vitals  Encounter Vitals Group     BP 12/02/23 1613 (!) 141/90     Girls Systolic BP Percentile --      Girls Diastolic BP Percentile --      Boys Systolic BP Percentile --      Boys Diastolic BP Percentile --      Pulse Rate 12/02/23 1613 73     Resp 12/02/23 1613 19     Temp 12/02/23 1613 98.8 F (37.1 C)     Temp Source 12/02/23 1613 Oral     SpO2 12/02/23 1613 100 %     Weight --      Height --      Head Circumference --      Peak Flow --      Pain Score 12/02/23 1601 3     Pain Loc --      Pain Education --      Exclude from Growth Chart --  No data found.  Updated Vital Signs BP (!) 141/90 (BP Location: Right Arm)   Pulse 73   Temp 98.8 F (37.1 C) (Oral)   Resp 19   SpO2 100%   Visual Acuity Right Eye Distance:   Left Eye Distance:   Bilateral Distance:    Right Eye Near:   Left Eye Near:    Bilateral Near:     Physical Exam Constitutional:      Appearance: Normal appearance.  Eyes:     Extraocular Movements: Extraocular movements intact.  Pulmonary:     Effort: Pulmonary effort is normal.  Abdominal:     General: Abdomen is flat. Bowel sounds are normal.     Palpations: Abdomen is soft.     Tenderness: There is abdominal tenderness in the right lower quadrant, epigastric area and left lower quadrant. There is no guarding.  Neurological:     Mental Status: He is alert and oriented to person, place, and time. Mental status is at baseline.      UC Treatments / Results  Labs (all labs ordered are listed, but only abnormal results are displayed) Labs Reviewed -  No data to display  EKG   Radiology No results found.  Procedures Procedures (including critical care time)  Medications Ordered in UC Medications  alum & mag hydroxide-simeth (MAALOX/MYLANTA) 200-200-20 MG/5ML suspension 30 mL (30 mLs Oral Given 12/02/23 1611)  lidocaine  (XYLOCAINE ) 2 % viscous mouth solution 15 mL (15 mLs Mouth/Throat Given 12/02/23 1611)    Initial Impression / Assessment and Plan / UC Course  I have reviewed the triage vital signs and the nursing notes.  Pertinent labs & imaging results that were available during my care of the patient were reviewed by me and considered in my medical decision making (see chart for details).  GERD without esophagitis  Vital signs are stable, patient in no signs of distress nontoxic-appearing, tenderness present to the epigastric and lower intestinal region, not guarding, able to sit comfortably during exam, stable for outpatient management, symptomology most consistent with GERD, discussed findings with patient, Maalox with lidocaine  given in clinic and on reevaluation after 10 to 15 minutes symptoms have begun to subside, prescribed omeprazole  and Maalox for home use and discussed administration recommended avoidance of irritating given written handout for evaluating dietary concerns as well as management, recommended nonpharmacological supportive care and advised follow-up as needed Final Clinical Impressions(s) / UC Diagnoses   Final diagnoses:  Gastroesophageal reflux disease without esophagitis     Discharge Instructions      Today you were evaluated abdominal symptoms which are most consistent with GERD commonly known as acid reflux or indigestion, more information in your packet  Begin omeprazole  daily for at least 2 weeks to reduce the amount of stomach acid which would help to minimize your symptoms overall  You may use Maalox every 6 hours as needed ideally take before meals to prevent irritation from your  food  May use additional over-the-counter medicine such as Tums Pepto and similar products  Avoid irritants while symptoms are severe  Avoid lying down directly after eating and sit up for at least 30 minutes  If feeling gas pains attempt to move around as this helps to break them up, may also try carbonation such as soda  If your symptoms continue to persist or worsen you may follow-up with urgent care for reevaluation as needed     ED Prescriptions     Medication Sig Dispense Auth. Provider   omeprazole  (  PRILOSEC) 20 MG capsule Take 1 capsule (20 mg total) by mouth daily. 30 capsule Laroy Mustard R, NP   aluminum -magnesium  hydroxide-simethicone  (MAALOX) 200-200-20 MG/5ML SUSP Take 30 mLs by mouth 4 (four) times daily -  before meals and at bedtime. 1,680 mL Teresa Shelba SAUNDERS, NP      PDMP not reviewed this encounter.   Teresa Shelba SAUNDERS, NP 12/02/23 1620

## 2023-12-02 NOTE — ED Triage Notes (Signed)
 Patient reports bloating , head ache and dizziness that started back today. Off and on for weeks. Patient reports taking tums with no relief.

## 2023-12-02 NOTE — Discharge Instructions (Addendum)
 Today you were evaluated abdominal symptoms which are most consistent with GERD commonly known as acid reflux or indigestion, more information in your packet  Begin omeprazole  daily for at least 2 weeks to reduce the amount of stomach acid which would help to minimize your symptoms overall  You may use Maalox every 6 hours as needed ideally take before meals to prevent irritation from your food  May use additional over-the-counter medicine such as Tums Pepto and similar products  Avoid irritants while symptoms are severe  Avoid lying down directly after eating and sit up for at least 30 minutes  If feeling gas pains attempt to move around as this helps to break them up, may also try carbonation such as soda  If your symptoms continue to persist or worsen you may follow-up with urgent care for reevaluation as needed

## 2024-01-28 ENCOUNTER — Encounter: Payer: Self-pay | Admitting: Emergency Medicine
